# Patient Record
Sex: Male | Born: 1965 | Race: Black or African American | Hispanic: No | Marital: Married | Smoking: Former smoker
Health system: Southern US, Community
[De-identification: ages and names within clinical notes are randomized; demographics above are authoritative.]

## PROBLEM LIST (undated history)

## (undated) DIAGNOSIS — E119 Type 2 diabetes mellitus without complications: Secondary | ICD-10-CM

## (undated) DIAGNOSIS — M51369 Other intervertebral disc degeneration, lumbar region without mention of lumbar back pain or lower extremity pain: Secondary | ICD-10-CM

## (undated) DIAGNOSIS — M5136 Other intervertebral disc degeneration, lumbar region: Secondary | ICD-10-CM

## (undated) DIAGNOSIS — G5600 Carpal tunnel syndrome, unspecified upper limb: Secondary | ICD-10-CM

## (undated) DIAGNOSIS — F4310 Post-traumatic stress disorder, unspecified: Secondary | ICD-10-CM

## (undated) DIAGNOSIS — M5126 Other intervertebral disc displacement, lumbar region: Secondary | ICD-10-CM

## (undated) HISTORY — DX: Post-traumatic stress disorder, unspecified: F43.10

---

## 1999-09-19 ENCOUNTER — Ambulatory Visit (HOSPITAL_COMMUNITY): Admission: RE | Admit: 1999-09-19 | Discharge: 1999-09-19 | Payer: Self-pay | Admitting: *Deleted

## 1999-09-19 ENCOUNTER — Encounter: Payer: Self-pay | Admitting: *Deleted

## 2000-03-30 ENCOUNTER — Encounter: Payer: Self-pay | Admitting: *Deleted

## 2000-03-30 ENCOUNTER — Inpatient Hospital Stay (HOSPITAL_COMMUNITY): Admission: EM | Admit: 2000-03-30 | Discharge: 2000-04-04 | Payer: Self-pay | Admitting: Emergency Medicine

## 2000-04-08 ENCOUNTER — Encounter: Admission: RE | Admit: 2000-04-08 | Discharge: 2000-07-07 | Payer: Self-pay | Admitting: *Deleted

## 2000-05-14 ENCOUNTER — Encounter: Admission: RE | Admit: 2000-05-14 | Discharge: 2000-06-05 | Payer: Self-pay | Admitting: Neurology

## 2001-04-25 ENCOUNTER — Emergency Department (HOSPITAL_COMMUNITY): Admission: EM | Admit: 2001-04-25 | Discharge: 2001-04-25 | Payer: Self-pay | Admitting: Emergency Medicine

## 2004-04-08 ENCOUNTER — Emergency Department (HOSPITAL_COMMUNITY): Admission: EM | Admit: 2004-04-08 | Discharge: 2004-04-08 | Payer: Self-pay | Admitting: Family Medicine

## 2004-07-01 ENCOUNTER — Emergency Department (HOSPITAL_COMMUNITY): Admission: EM | Admit: 2004-07-01 | Discharge: 2004-07-01 | Payer: Self-pay | Admitting: Family Medicine

## 2007-09-28 ENCOUNTER — Ambulatory Visit: Payer: Self-pay | Admitting: Nurse Practitioner

## 2007-09-28 DIAGNOSIS — E119 Type 2 diabetes mellitus without complications: Secondary | ICD-10-CM | POA: Insufficient documentation

## 2007-09-28 LAB — CONVERTED CEMR LAB
ALT: 31 units/L (ref 0–53)
AST: 16 units/L (ref 0–37)
Albumin: 4.5 g/dL (ref 3.5–5.2)
Alkaline Phosphatase: 61 units/L (ref 39–117)
BUN: 11 mg/dL (ref 6–23)
Basophils Absolute: 0 10*3/uL (ref 0.0–0.1)
Basophils Relative: 0 % (ref 0–1)
Bilirubin Urine: NEGATIVE
Blood Glucose, Fingerstick: 257
Blood in Urine, dipstick: NEGATIVE
CO2: 29 meq/L (ref 19–32)
Calcium: 9.8 mg/dL (ref 8.4–10.5)
Chloride: 102 meq/L (ref 96–112)
Cholesterol: 169 mg/dL (ref 0–200)
Creatinine, Ser: 1.06 mg/dL (ref 0.40–1.50)
Eosinophils Absolute: 0.1 10*3/uL (ref 0.0–0.7)
Eosinophils Relative: 1 % (ref 0–5)
Glucose, Bld: 195 mg/dL — ABNORMAL HIGH (ref 70–99)
Glucose, Urine, Semiquant: NEGATIVE
HCT: 50.4 % (ref 39.0–52.0)
HDL: 53 mg/dL (ref >39)
Hemoglobin: 15.9 g/dL (ref 13.0–17.0)
Hgb A1c MFr Bld: 7.2 %
Ketones, urine, test strip: NEGATIVE
LDL Cholesterol: 95 mg/dL (ref 0–99)
Lymphocytes Relative: 35 % (ref 12–46)
Lymphs Abs: 1.7 10*3/uL (ref 0.7–3.3)
MCHC: 31.5 g/dL (ref 30.0–36.0)
MCV: 100.8 fL — ABNORMAL HIGH (ref 78.0–100.0)
Microalb, Ur: 0.25 mg/dL (ref 0.00–1.89)
Monocytes Absolute: 0.3 10*3/uL (ref 0.2–0.7)
Monocytes Relative: 7 % (ref 3–11)
Neutro Abs: 2.7 10*3/uL (ref 1.7–7.7)
Neutrophils Relative %: 56 % (ref 43–77)
Nitrite: NEGATIVE
PSA: 0.99 ng/mL (ref 0.10–4.00)
Platelets: 330 10*3/uL (ref 150–400)
Potassium: 5 meq/L (ref 3.5–5.3)
Protein, U semiquant: NEGATIVE
RBC: 5 M/uL (ref 4.22–5.81)
RDW: 12.6 % (ref 11.5–14.0)
Sodium: 142 meq/L (ref 135–145)
Specific Gravity, Urine: 1.01
Total Bilirubin: 0.8 mg/dL (ref 0.3–1.2)
Total CHOL/HDL Ratio: 3.2
Total Protein: 7.6 g/dL (ref 6.0–8.3)
Triglycerides: 106 mg/dL (ref <150)
Urobilinogen, UA: 0.2
VLDL: 21 mg/dL (ref 0–40)
WBC Urine, dipstick: NEGATIVE
WBC: 4.9 10*3/uL (ref 4.0–10.5)
pH: 5.5

## 2007-11-02 ENCOUNTER — Ambulatory Visit: Payer: Self-pay | Admitting: Nurse Practitioner

## 2007-11-02 DIAGNOSIS — F528 Other sexual dysfunction not due to a substance or known physiological condition: Secondary | ICD-10-CM | POA: Insufficient documentation

## 2007-11-08 ENCOUNTER — Encounter (INDEPENDENT_AMBULATORY_CARE_PROVIDER_SITE_OTHER): Payer: Self-pay | Admitting: Nurse Practitioner

## 2007-12-22 ENCOUNTER — Ambulatory Visit: Payer: Self-pay | Admitting: Nurse Practitioner

## 2007-12-22 LAB — CONVERTED CEMR LAB
Blood Glucose, Fingerstick: 159
Hgb A1c MFr Bld: 8 %

## 2007-12-28 ENCOUNTER — Ambulatory Visit: Payer: Self-pay | Admitting: Nurse Practitioner

## 2008-02-02 ENCOUNTER — Ambulatory Visit: Payer: Self-pay | Admitting: Nurse Practitioner

## 2008-02-02 LAB — CONVERTED CEMR LAB: Blood Glucose, Fingerstick: 128

## 2008-02-08 ENCOUNTER — Encounter (INDEPENDENT_AMBULATORY_CARE_PROVIDER_SITE_OTHER): Payer: Self-pay | Admitting: Nurse Practitioner

## 2008-03-21 ENCOUNTER — Telehealth (INDEPENDENT_AMBULATORY_CARE_PROVIDER_SITE_OTHER): Payer: Self-pay | Admitting: Nurse Practitioner

## 2008-03-22 ENCOUNTER — Ambulatory Visit: Payer: Self-pay | Admitting: *Deleted

## 2008-04-07 ENCOUNTER — Ambulatory Visit: Payer: Self-pay | Admitting: Nurse Practitioner

## 2008-04-07 DIAGNOSIS — L0292 Furuncle, unspecified: Secondary | ICD-10-CM | POA: Insufficient documentation

## 2008-04-07 DIAGNOSIS — L0293 Carbuncle, unspecified: Secondary | ICD-10-CM

## 2008-05-10 ENCOUNTER — Ambulatory Visit: Payer: Self-pay | Admitting: Nurse Practitioner

## 2008-05-10 DIAGNOSIS — B353 Tinea pedis: Secondary | ICD-10-CM

## 2008-05-10 LAB — CONVERTED CEMR LAB
Blood Glucose, Fingerstick: 178
Hgb A1c MFr Bld: 7.9 %

## 2008-06-14 ENCOUNTER — Telehealth (INDEPENDENT_AMBULATORY_CARE_PROVIDER_SITE_OTHER): Payer: Self-pay | Admitting: *Deleted

## 2008-06-28 ENCOUNTER — Ambulatory Visit: Payer: Self-pay | Admitting: Nurse Practitioner

## 2008-06-28 LAB — CONVERTED CEMR LAB: Blood Glucose, Fingerstick: 68

## 2008-07-10 ENCOUNTER — Telehealth (INDEPENDENT_AMBULATORY_CARE_PROVIDER_SITE_OTHER): Payer: Self-pay | Admitting: Nurse Practitioner

## 2008-09-04 ENCOUNTER — Telehealth (INDEPENDENT_AMBULATORY_CARE_PROVIDER_SITE_OTHER): Payer: Self-pay | Admitting: *Deleted

## 2008-09-12 ENCOUNTER — Telehealth (INDEPENDENT_AMBULATORY_CARE_PROVIDER_SITE_OTHER): Payer: Self-pay | Admitting: Nurse Practitioner

## 2008-10-20 ENCOUNTER — Telehealth (INDEPENDENT_AMBULATORY_CARE_PROVIDER_SITE_OTHER): Payer: Self-pay | Admitting: Nurse Practitioner

## 2008-11-28 ENCOUNTER — Telehealth (INDEPENDENT_AMBULATORY_CARE_PROVIDER_SITE_OTHER): Payer: Self-pay | Admitting: Nurse Practitioner

## 2008-12-06 ENCOUNTER — Telehealth (INDEPENDENT_AMBULATORY_CARE_PROVIDER_SITE_OTHER): Payer: Self-pay | Admitting: Nurse Practitioner

## 2009-01-03 ENCOUNTER — Ambulatory Visit: Payer: Self-pay | Admitting: Nurse Practitioner

## 2009-01-03 DIAGNOSIS — E7800 Pure hypercholesterolemia, unspecified: Secondary | ICD-10-CM | POA: Insufficient documentation

## 2009-01-03 DIAGNOSIS — M79609 Pain in unspecified limb: Secondary | ICD-10-CM | POA: Insufficient documentation

## 2009-01-03 LAB — CONVERTED CEMR LAB
Basophils Absolute: 0 10*3/uL (ref 0.0–0.1)
Blood in Urine, dipstick: NEGATIVE
CO2: 27 meq/L (ref 19–32)
Cholesterol: 178 mg/dL (ref 0–200)
Creatinine, Ser: 1.05 mg/dL (ref 0.40–1.50)
Eosinophils Absolute: 0 10*3/uL (ref 0.0–0.7)
Eosinophils Relative: 1 % (ref 0–5)
Glucose, Bld: 105 mg/dL — ABNORMAL HIGH (ref 70–99)
Glucose, Urine, Semiquant: NEGATIVE
HCT: 47.3 % (ref 39.0–52.0)
Hemoglobin: 15.2 g/dL (ref 13.0–17.0)
Lymphocytes Relative: 34 % (ref 12–46)
Lymphs Abs: 1.5 10*3/uL (ref 0.7–4.0)
MCV: 99.2 fL (ref 78.0–100.0)
Monocytes Absolute: 0.5 10*3/uL (ref 0.1–1.0)
Protein, U semiquant: NEGATIVE
RDW: 12.6 % (ref 11.5–15.5)
Total Bilirubin: 0.8 mg/dL (ref 0.3–1.2)
Total CHOL/HDL Ratio: 3.1
Triglycerides: 76 mg/dL (ref <150)
Urobilinogen, UA: 1
VLDL: 15 mg/dL (ref 0–40)
WBC Urine, dipstick: NEGATIVE
pH: 6

## 2009-01-04 ENCOUNTER — Encounter (INDEPENDENT_AMBULATORY_CARE_PROVIDER_SITE_OTHER): Payer: Self-pay | Admitting: Nurse Practitioner

## 2009-01-05 ENCOUNTER — Ambulatory Visit: Payer: Self-pay | Admitting: Nurse Practitioner

## 2009-01-09 ENCOUNTER — Telehealth (INDEPENDENT_AMBULATORY_CARE_PROVIDER_SITE_OTHER): Payer: Self-pay | Admitting: *Deleted

## 2009-01-10 ENCOUNTER — Ambulatory Visit: Payer: Self-pay | Admitting: Family Medicine

## 2009-01-10 ENCOUNTER — Encounter (INDEPENDENT_AMBULATORY_CARE_PROVIDER_SITE_OTHER): Payer: Self-pay | Admitting: Nurse Practitioner

## 2009-01-15 ENCOUNTER — Encounter (INDEPENDENT_AMBULATORY_CARE_PROVIDER_SITE_OTHER): Payer: Self-pay | Admitting: Nurse Practitioner

## 2009-01-17 ENCOUNTER — Encounter (INDEPENDENT_AMBULATORY_CARE_PROVIDER_SITE_OTHER): Payer: Self-pay | Admitting: Nurse Practitioner

## 2009-01-26 ENCOUNTER — Telehealth (INDEPENDENT_AMBULATORY_CARE_PROVIDER_SITE_OTHER): Payer: Self-pay | Admitting: Nurse Practitioner

## 2009-01-26 DIAGNOSIS — S62113A Displaced fracture of triquetrum [cuneiform] bone, unspecified wrist, initial encounter for closed fracture: Secondary | ICD-10-CM

## 2009-01-29 ENCOUNTER — Ambulatory Visit (HOSPITAL_COMMUNITY): Admission: RE | Admit: 2009-01-29 | Discharge: 2009-01-29 | Payer: Self-pay | Admitting: Family Medicine

## 2009-02-20 ENCOUNTER — Telehealth (INDEPENDENT_AMBULATORY_CARE_PROVIDER_SITE_OTHER): Payer: Self-pay | Admitting: Nurse Practitioner

## 2011-01-12 LAB — CONVERTED CEMR LAB
Blood Glucose, Fingerstick: 186
Hgb A1c MFr Bld: 8 %

## 2011-01-27 ENCOUNTER — Inpatient Hospital Stay (INDEPENDENT_AMBULATORY_CARE_PROVIDER_SITE_OTHER)
Admission: RE | Admit: 2011-01-27 | Discharge: 2011-01-27 | Disposition: A | Discharge status: Home / Self Care | Payer: BC Managed Care – PPO | Source: Ambulatory Visit | Attending: Emergency Medicine | Admitting: Emergency Medicine

## 2011-01-27 DIAGNOSIS — L738 Other specified follicular disorders: Secondary | ICD-10-CM

## 2011-05-02 NOTE — Discharge Summary (Signed)
[place]. [facility]  Patient:    Chris [name], Chris [name]                       MRN: [id number] Adm. Date:  [id number] Disc. Date: [id number] Attending:  Sharyn Dross                           Discharge Summary  ADMITTING DIAGNOSIS:  Uncontrolled diabetes mellitus, new onset.  DISCHARGE DIAGNOSES:  1. Diabetes mellitus, still uncontrolled (new onset, possible ___________     syndrome).  2. Lower back pain, stable.  CONDITION ON DISCHARGE:  Stable.  DISCHARGE MEDICATIONS:  1. Actos 15 mg p.o. in a.m.  2. Glucotrol XL 10 mg b.i.d.  3. Glucovance 5/500 two tablets b.i.d.  COMPLICATIONS:  None.  CONSULTATIONS:  Dr. Ardyth Harps, endocrinologist.  HOSPITAL COURSE:  The patient was admitted into the hospital with marked evidence of uncontrolled diabetes.  He was primarily admitted because of increasing discomforts with fever, weakness, and dizziness.  It was felt this possibly could be related to Neurontin problems at this time.  Followup, when the patient was found to be hyperglycemia, the treatment was initially and aggressively geared towards getting him under control.  The patient was started with saline fluid for hydration, as well as insulin to help to bring him down.  It was noted during this process that while attempting to improve his insulin levels, the glucose levels  continually rebounded to the 300 plus range.  The patients insulin coverage had to be adjusted to higher numbers with still elevations of this process noted.  An endocrine consultation was obtained to help with the management of the patient and his overall condition.  It was interesting, but initially noted that the patient could be a candidate for having what has been diagnosed as the ___________ syndrome.  In discussing with the endocrinologist, it was noted that the patient fit the category very well (age, symptoms, and response to therapy).  The recommendation was to continue  with the oral hypoglycemic agents and to hold with the insulin dosages at this time.  This has been done, but the process of response appears to be slow.  In discussing with the endocrinologist, it was felt that this may take time to be ongoing.  The patients condition was stable to a point that it was felt (both by the endocrinologist and myself), that the patient could be discharged to home taking the oral hypoglycemic agents.  He should be followed for the management and adjustments of his medications to get this under control.  The family states that the insurance company has their own endocrinologist that  they want the patient to be seen by.  Because of the economic constraints that re noted, they wish that this could be complied with.  Thus, the patient was referred to another endocrinologist for the management of this process at this time.  FOLLOWUP:  The patient is to follow up with me in approximately two weeks to monitor his sugars on an hour before meals and bedtime basis and to notify me if there is are any problems or complications, until such time as the consultation  could be obtained.  The patient and the family was also notified that if the new endocrine consultation evaluation visit is too far off, then he should continue to see the present endocrinologist during the interim until this process could be better controlled, unless  he starts to respond to therapy.  FINAL DIAGNOSES:  As above.  CLINICAL CONDITION:  Stable. DD:  04/04/00 TD:  04/06/00 Job: [id number]

## 2011-05-02 NOTE — Consult Note (Signed)
[place]. [facility]  Patient:    Chris [name], Chris [name]                       MRN: [id number] Proc. Date: 04/02/00 Adm. Date:  [id number] Attending:  Sharyn Dross CC:         Sharyn Dross., M.D.                          Consultation Report  Chris [name] is a 45 year old black male who presented on April 16 with new onset diabetes.  He had symptoms present for 3-4 days with a 15-20 pound weight loss ver an unspecified amount of time, nocturia, blurred vision, polydipsia, polyuria, nd some neuropathic complaints.  He had had prior neuropathic complaints from a herniated disk and attributed  some of the symptoms to that.  His energy was down and he had felt less well.  He had had no fevers, chills, or sweats.  He had had no other neurologic complaints.  No breathing trouble or chest pain.  He had no prior history of any abnormal blood sugar, blood pressure, or dyslipidemia.  He presented with significant acidosis with the bicarb down to 13 and positive urine ketones at greater than 80 mg/dL, with initial blood sugar of 434.  He did not have a measurement of serum ketones at that time.  A CT scan of the head was negative nd cardiogram was also normal at presentation.  He has been given vigorous hydration and IV insulin drip, and blood sugars have markedly improved.  Most recently today, his blood sugars are down, 247 at midnight, 199 at 1 a.m., and 146 at 6 a.m. His symptoms have markedly improved.  PAST MEDICAL HISTORY:  Positive for the herniated disk and an elbow injury.  FAMILY HISTORY:  Type 2 diabetes in the mother and some cousins.  SOCIAL HISTORY:  He is married.  He has two of his own children, three stepchildren ranging from ages 73-14.  He is employed with [employer].  He is a nonsmoker, nondrinker.  PHYSICAL EXAMINATION:  VITAL SIGNS:  Currently, blood pressure is 120/63, pulse 84 and regular, respirations 20, unlabored.  Temperature  98.4.  GENERAL:  We have a healthy-appearing black male sitting on the edge of his bed in no distress.  HEENT:  Sclerae anicteric.  There is no lid lag or exophthalmus.  Mucous membranes are moist.  There is no evidence of oral thrush.  Oral mucous membranes are well hydrated.  NECK:  Supple without thyromegaly, bruits, or JVD.  LUNGS:  Clear without wheezes, rales, or rhonchi.  HEART:  Regular without murmurs, rubs, gallops.  ABDOMEN:  Soft and nontender with no masses or hepatosplenomegaly.  EXTREMITIES:  Strong distal pulses with no peripheral edema.  SKIN:  Positive for acanthosis nigricans prominently around the neck.  NEUROLOGIC:  He is alert and oriented.  Mentation is good.  No tremors present.  LABORATORY DATA:  Most recent sodium 134, potassium 3.6, chloride 101, CO2 23, UN 14, creatinine 1.0, glucose 443, calcium 9.1.  At presentation, white count was  9500, hemoglobin 18.1, platelets 200,000.  Initial chemistry:  Sodium 135, potassium 5.5, chloride 100, CO2 13, BUN 18, creatinine 1.7, glucose 434, total  protein 8.8, albumin 4.4, AST 20.  Initial lipids: Total cholesterol 338, triglycerides 619, HDL 67.  Urinalysis showed greater than 100 mg glucose, greater than 80 mg ketones, and 100 mg%  protein.  IMPRESSION:  In summary, we have a 45 year old black male presenting with new onset diabetes.  At presentation he was at least relatively insulin deficient with modest acidosis and hyperglycemia.  His acidosis has promptly resolved and his renal function has normalized.  His dehydration seems to be resolved.  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  Evidence that supports this includes the finding of  acanthosis nigricans on his physical exam, his underlying obesity, his dyslipidemia, and diabetes type 2 in a first-degree relative.  This would predict he would respond well  to oral agents.  Other evidence that would support this, f needed, would be a C-peptide level, although I would not check it at this point, or more importantly, a stimulated C-peptide level.  The usefulness of antibodies such as islet antibodies and anti-GAD antibodies are limited in non-Caucasian. Given that his creatinine is now down to 1.0, I would restart the Glucophage.  I agree with this, and I would continue the Glucotrol and watch carefully for 24-48 hours further.  Encourage oral hydration and watch him carefully.  We will have to monitor him for any signs of extreme hyperglycemia.  The possibility does still  exist that he is a more typical type 1 diabetes and will require insulin therapy. DD:  04/02/00 TD:  04/02/00 Job: [id number]

## 2011-05-02 NOTE — H&P (Signed)
[place]. [facility]  Patient:    Chris [name], Chris [name].                      MRN: [id number] Adm. Date:  03/30/00 Attending:  Sharyn Dross., M.D.                         History and Physical  BRIEF HISTORY:  This pleasant 45 year old gentleman presented and was admitted into the hospital because of new onset of uncontrolled diabetes mellitus.  The patient has been seen by me over the last few weeks because of severe back pain and some discomforts that was ongoing.  The patient had difficulty with lifting and bending that was present at that time.  The patient had evidence of a CT Scan of the lumbosacral region which showed evidence of possible stenosis that may be involved with the risk region at that time.  He was started with neurontin medication to take t.i.d. approximately 600 mg three time a day at that time.  The patient states that while on this medication the patient started developing increasing blurred vision and discomfort that was noted.  He presented back to the office today with associated nauseousness and vomiting that had started early in the morning with increased dizziness and weakness ongoing.  Upon evaluation in the office it was felt that the patient needed to be seen in the emergency room for laboratory data as well a repeat scan.  Associated with this problem is headaches that was noted.  He denied any history of any polyuria or polydipsia initially because it was not entertained and considering this to be in relationship with diabetes it felt it was not related to what his symptoms were.  In the emergency room the patient had lab work done and I was notified of a glucose level of 434.  In discussing with the patient the patient does admit to having increased polyuria and polydipsia as well as nocturia that has been ongoing now for the past few weeks that was present.  The patient denies any family history of diabetes mellitus that was  present at this time.  PHYSICAL EXAMINATION:  This pleasant gentleman appears to be in mild to moderate distress at this time.  VITAL SIGNS:  Appear to be stable with a blood pressure of 136/74, repeated it was 148/98.  Pulse rate 106, and on repeat it was a 103.  His temperature was 99.0, repeated it was 98.2.  Respiratory rate was approximately 20 that was noted.  HEENT:  Appears to be anicteric.  NECK:  Appears to be supple.  LUNGS:  Clear to auscultation and percussion.  HEART:  Regular rate and rhythm without heaves, thrills, murmurs or gallops that was noted.  ABDOMEN:  Was soft.  There was no major tenderness to palpation that was appreciated.  Bowel sounds were decreased that was noted.  EXTREMITIES:  Showed no clubbing, cyanosis, or edema.  GROSS IMPRESSION: 1. Uncontrolled diabetes mellitus, new onset. 2. Lower back pains, possible herniated nucleus pulposus.  PRESENT RECOMMENDATIONS: 1. Will admit the patient into the hospital for hydration and stabilization. 2. We started the patient with a glucometer to monitor his sugar levels. 3. Will have dietician consultation for education of the patient with    diabetes as well as his family during this process. 4. Will also obtain other tests that may be beneficial for the patient.    Depending upon these  results will determine the course of therapy. DD:  03/30/00 TD:  03/30/00 Job: [id number]

## 2013-10-26 ENCOUNTER — Emergency Department (HOSPITAL_COMMUNITY)
Admission: EM | Admit: 2013-10-26 | Discharge: 2013-10-26 | Disposition: A | Discharge status: Home / Self Care | Payer: BC Managed Care – PPO | Attending: Emergency Medicine | Admitting: Emergency Medicine

## 2013-10-26 ENCOUNTER — Encounter (HOSPITAL_COMMUNITY): Payer: Self-pay | Admitting: Emergency Medicine

## 2013-10-26 ENCOUNTER — Emergency Department (HOSPITAL_COMMUNITY): Payer: BC Managed Care – PPO

## 2013-10-26 DIAGNOSIS — Z8739 Personal history of other diseases of the musculoskeletal system and connective tissue: Secondary | ICD-10-CM | POA: Insufficient documentation

## 2013-10-26 DIAGNOSIS — Z79899 Other long term (current) drug therapy: Secondary | ICD-10-CM | POA: Insufficient documentation

## 2013-10-26 DIAGNOSIS — Z87891 Personal history of nicotine dependence: Secondary | ICD-10-CM | POA: Insufficient documentation

## 2013-10-26 DIAGNOSIS — E119 Type 2 diabetes mellitus without complications: Secondary | ICD-10-CM | POA: Insufficient documentation

## 2013-10-26 DIAGNOSIS — Z8669 Personal history of other diseases of the nervous system and sense organs: Secondary | ICD-10-CM | POA: Insufficient documentation

## 2013-10-26 DIAGNOSIS — R1011 Right upper quadrant pain: Secondary | ICD-10-CM | POA: Insufficient documentation

## 2013-10-26 DIAGNOSIS — Z794 Long term (current) use of insulin: Secondary | ICD-10-CM | POA: Insufficient documentation

## 2013-10-26 HISTORY — DX: Other intervertebral disc degeneration, lumbar region: M51.36

## 2013-10-26 HISTORY — DX: Other intervertebral disc displacement, lumbar region: M51.26

## 2013-10-26 HISTORY — DX: Type 2 diabetes mellitus without complications: E11.9

## 2013-10-26 HISTORY — DX: Other intervertebral disc degeneration, lumbar region without mention of lumbar back pain or lower extremity pain: M51.369

## 2013-10-26 HISTORY — DX: Carpal tunnel syndrome, unspecified upper limb: G56.00

## 2013-10-26 LAB — URINALYSIS, ROUTINE W REFLEX MICROSCOPIC
Ketones, ur: NEGATIVE mg/dL
Leukocytes, UA: NEGATIVE
Nitrite: NEGATIVE
Protein, ur: NEGATIVE mg/dL
Urobilinogen, UA: 2 mg/dL — ABNORMAL HIGH (ref 0.0–1.0)
pH: 6.5 (ref 5.0–8.0)

## 2013-10-26 LAB — CBC WITH DIFFERENTIAL/PLATELET
Eosinophils Absolute: 0.1 10*3/uL (ref 0.0–0.7)
Eosinophils Relative: 2 % (ref 0–5)
HCT: 39.7 % (ref 39.0–52.0)
Hemoglobin: 13.3 g/dL (ref 13.0–17.0)
Lymphs Abs: 2.3 10*3/uL (ref 0.7–4.0)
MCH: 31.4 pg (ref 26.0–34.0)
MCV: 93.6 fL (ref 78.0–100.0)
Monocytes Relative: 8 % (ref 3–12)
Neutrophils Relative %: 51 % (ref 43–77)
RBC: 4.24 MIL/uL (ref 4.22–5.81)

## 2013-10-26 LAB — COMPREHENSIVE METABOLIC PANEL
Alkaline Phosphatase: 76 U/L (ref 39–117)
BUN: 12 mg/dL (ref 6–23)
GFR calc Af Amer: >90 mL/min (ref >90)
Glucose, Bld: 166 mg/dL — ABNORMAL HIGH (ref 70–99)
Potassium: 3.9 mEq/L (ref 3.5–5.1)
Total Protein: 7.1 g/dL (ref 6.0–8.3)

## 2013-10-26 LAB — LIPASE, BLOOD: Lipase: 96 U/L — ABNORMAL HIGH (ref 11–59)

## 2013-10-26 MED ORDER — OXYCODONE-ACETAMINOPHEN 5-325 MG PO TABS: 2.0000 | ORAL_TABLET | ORAL | Status: DC | PRN

## 2013-10-26 NOTE — ED Provider Notes (Signed)
CSN: [id number]     Arrival date & time 10/26/13  1809 History   First MD Initiated Contact with Patient 10/26/13 1909     Chief Complaint  Patient presents with  . Abdominal Pain   (Consider location/radiation/quality/duration/timing/severity/associated sxs/prior Treatment) HPI This 47 year old diabetic states his blood sugars usually run less than 200, he is here with intermittent right upper quadrant abdominal pain for the last several days, he has had no nausea vomiting diarrhea or dysuria or hematuria, he is no chest pain cough shortness of breath or back pain, he has nonexertional nonpleuritic non-positional sudden sharp colicky intermittent and last less than several minutes at a time, he has several episodes per day over the last several days, he is no testicular pain, he is no rash, there is no treatment prior to arrival. Past Medical History  Diagnosis Date  . Diabetes mellitus without complication   . Bulging lumbar disc   . Carpal tunnel syndrome    History reviewed. No pertinent past surgical history. Family History  Problem Relation Age of Onset  . Diabetes Mother    History  Substance Use Topics  . Smoking status: Former Games developer  . Smokeless tobacco: Not on file  . Alcohol Use: No    Review of Systems 10 Systems reviewed and are negative for acute change except as noted in the HPI. Allergies  Review of patient's allergies indicates no known allergies.  Home Medications   Current Outpatient Rx  Name  Route  Sig  Dispense  Refill  . atorvastatin (LIPITOR) 40 MG tablet   Oral   Take 20 mg by mouth daily.         Marland Kitchen HYDROcodone-acetaminophen (NORCO) 7.5-325 MG per tablet   Oral   Take 1 tablet by mouth every 6 (six) hours as needed for moderate pain.         Marland Kitchen LANTUS 100 UNIT/ML injection   Subcutaneous   Inject 50 Units into the skin at bedtime.          Marland Kitchen NOVOLOG FLEXPEN 100 UNIT/ML SOPN FlexPen   Subcutaneous   Inject 5-7 Units into the skin 3  (three) times daily with meals. Per sliding scale         . omeprazole (PRILOSEC) 40 MG capsule   Oral   Take 40 mg by mouth daily.         Marland Kitchen oxyCODONE-acetaminophen (PERCOCET) 5-325 MG per tablet   Oral   Take 2 tablets by mouth every 4 (four) hours as needed.   6 tablet   0    BP 135/91  Pulse 79  Temp(Src) 98.1 F (36.7 C) (Oral)  Resp 20  SpO2 97% Physical Exam  Nursing note and vitals reviewed. Constitutional:  Awake, alert, nontoxic appearance.  HENT:  Head: Atraumatic.  Eyes: Right eye exhibits no discharge. Left eye exhibits no discharge.  Neck: Neck supple.  Cardiovascular: Normal rate and regular rhythm.   No murmur heard. Pulmonary/Chest: Effort normal and breath sounds normal. No respiratory distress. He has no wheezes. He has no rales. He exhibits no tenderness.  Abdominal: Soft. Bowel sounds are normal. He exhibits no distension and no mass. There is tenderness. There is no rebound and no guarding.  Mild right upper quadrant tenderness with the rest of the abdomen nontender  Genitourinary:  No CVA tenderness  Musculoskeletal: He exhibits no tenderness.  Baseline ROM, no obvious new focal weakness. No back tenderness and no rash noted on his back flank or abdomen.  Neurological: He is alert.  Mental status and motor strength appears baseline for patient and situation.  Skin: No rash noted.  Psychiatric: He has a normal mood and affect.    ED Course  Procedures (including critical care time) Repeat examination still mild right upper quadrant tenderness only with the rest of abdomen nontender, he'll recheck tomorrow or reasonable and the patient agrees, lipase mildly elevated but not diagnostic for pancreatitis. 2145  Labs Review Labs Reviewed  COMPREHENSIVE METABOLIC PANEL - Abnormal; Notable for the following:    Glucose, Bld 166 (*)    All other components within normal limits  URINALYSIS, ROUTINE W REFLEX MICROSCOPIC - Abnormal; Notable for the  following:    Urobilinogen, UA 2.0 (*)    All other components within normal limits  GLUCOSE, CAPILLARY - Abnormal; Notable for the following:    Glucose-Capillary 169 (*)    All other components within normal limits  LIPASE, BLOOD - Abnormal; Notable for the following:    Lipase 96 (*)    All other components within normal limits  CBC WITH DIFFERENTIAL   Imaging Review No results found.  EKG Interpretation   None       MDM   1. Right upper quadrant abdominal pain    I doubt any other EMC precluding discharge at this time including, but not necessarily limited to the following:SBI.    Hurman Horn, MD 10/31/13 [phone]

## 2013-10-26 NOTE — ED Notes (Signed)
Pt went to urgent care and was started on a medication to decrease his stomach acid. States that he has taken that for 2 days with no change. Pt also states that he has brief period of relief with flatus and when having a BM.

## 2013-10-26 NOTE — ED Notes (Signed)
Pt c/o RLQ pain x1wk, pain radiates upper and to the side., denies n/v/d

## 2013-10-27 ENCOUNTER — Encounter (HOSPITAL_COMMUNITY): Payer: Self-pay | Admitting: Emergency Medicine

## 2013-10-27 ENCOUNTER — Emergency Department (HOSPITAL_COMMUNITY)
Admission: EM | Admit: 2013-10-27 | Discharge: 2013-10-27 | Disposition: A | Discharge status: Home / Self Care | Payer: BC Managed Care – PPO | Attending: Emergency Medicine | Admitting: Emergency Medicine

## 2013-10-27 DIAGNOSIS — R109 Unspecified abdominal pain: Secondary | ICD-10-CM | POA: Insufficient documentation

## 2013-10-27 DIAGNOSIS — Z794 Long term (current) use of insulin: Secondary | ICD-10-CM | POA: Insufficient documentation

## 2013-10-27 DIAGNOSIS — Z8669 Personal history of other diseases of the nervous system and sense organs: Secondary | ICD-10-CM | POA: Insufficient documentation

## 2013-10-27 DIAGNOSIS — Z8739 Personal history of other diseases of the musculoskeletal system and connective tissue: Secondary | ICD-10-CM | POA: Insufficient documentation

## 2013-10-27 DIAGNOSIS — E119 Type 2 diabetes mellitus without complications: Secondary | ICD-10-CM | POA: Insufficient documentation

## 2013-10-27 DIAGNOSIS — Z79899 Other long term (current) drug therapy: Secondary | ICD-10-CM | POA: Insufficient documentation

## 2013-10-27 DIAGNOSIS — Z87891 Personal history of nicotine dependence: Secondary | ICD-10-CM | POA: Insufficient documentation

## 2013-10-27 LAB — GLUCOSE, CAPILLARY: Glucose-Capillary: 80 mg/dL (ref 70–99)

## 2013-10-27 NOTE — ED Notes (Signed)
Patient was here last night. Patient states need for CT scan. PCP was not able to schedule appointment for today. Appt scheduled for 11/04/13. Returned to ED for CT scan.

## 2013-10-27 NOTE — ED Provider Notes (Signed)
CSN: [id number]     Arrival date & time 10/27/13  1103 History   First MD Initiated Contact with Patient 10/27/13 1132     Chief Complaint  Patient presents with  . Abdominal Pain   (Consider location/radiation/quality/duration/timing/severity/associated sxs/prior Treatment) Patient is a 47 y.o. male presenting with abdominal pain.  Abdominal Pain Associated symptoms: no chills, no constipation, no diarrhea, no dysuria, no fatigue, no fever, no hematuria, no nausea and no vomiting     Patient was instructed to have a a CT exam today by ED physician last night. He planned to complete this at [place], but was unable to get this done. Of note, the patients abdominal pain is resolving. The patient has not required any pain medication today. He is tolerating food and water without problems.   Past Medical History  Diagnosis Date  . Diabetes mellitus without complication   . Bulging lumbar disc   . Carpal tunnel syndrome    History reviewed. No pertinent past surgical history. Family History  Problem Relation Age of Onset  . Diabetes Mother    History  Substance Use Topics  . Smoking status: Former Games developer  . Smokeless tobacco: Not on file  . Alcohol Use: No    Review of Systems  Constitutional: Negative for fever, chills, activity change, appetite change and fatigue.  Gastrointestinal: Positive for abdominal pain. Negative for nausea, vomiting, diarrhea, constipation, blood in stool, abdominal distention and rectal pain.  Genitourinary: Negative for dysuria, frequency, hematuria and flank pain.  Neurological: Negative for dizziness, light-headedness and headaches.    Allergies  Review of patient's allergies indicates no known allergies.  Home Medications   Current Outpatient Rx  Name  Route  Sig  Dispense  Refill  . atorvastatin (LIPITOR) 40 MG tablet   Oral   Take 20 mg by mouth daily.         Marland Kitchen HYDROcodone-acetaminophen (NORCO) 7.5-325 MG per tablet   Oral   Take 1  tablet by mouth every 6 (six) hours as needed for moderate pain.         Marland Kitchen LANTUS 100 UNIT/ML injection   Subcutaneous   Inject 50 Units into the skin at bedtime.          Marland Kitchen NOVOLOG FLEXPEN 100 UNIT/ML SOPN FlexPen   Subcutaneous   Inject 5-7 Units into the skin 3 (three) times daily with meals. Per sliding scale         . omeprazole (PRILOSEC) 40 MG capsule   Oral   Take 40 mg by mouth daily.         Marland Kitchen oxyCODONE-acetaminophen (PERCOCET) 5-325 MG per tablet   Oral   Take 2 tablets by mouth every 4 (four) hours as needed.   6 tablet   0    BP 136/87  Pulse 78  Temp(Src) 98.1 F (36.7 C) (Oral)  Resp 12  SpO2 96% Physical Exam  Constitutional: He appears well-developed and well-nourished. He appears distressed.  HENT:  Head: Normocephalic.  Mouth/Throat: Oropharynx is clear and moist. No oropharyngeal exudate.  Cardiovascular: Normal rate, regular rhythm, normal heart sounds and intact distal pulses.   Abdominal: Soft. Bowel sounds are normal. He exhibits no distension and no mass. There is no tenderness. There is no rebound and no guarding.  Neurological: He is alert.  Skin: Skin is warm. He is not diaphoretic.  Psychiatric: He has a normal mood and affect. His behavior is normal.    ED Course  Procedures (including critical care time)  Labs Review Labs Reviewed  GLUCOSE, CAPILLARY   Imaging Review US Abdomen Complete  10/26/2013   CLINICAL DATA:  Right upper quadrant abdominal pain.  EXAM: ULTRASOUND ABDOMEN COMPLETE  COMPARISON:  None.  FINDINGS: Gallbladder  No gallstones or wall thickening visualized. No sonographic Murphy sign noted. Wall thickness is 1.8 mm, within normal limits.  Common bile duct  Diameter: 3.7 mm, within normal limits.  Liver  The liver is diffusely hyperechoic with loss of normal internal architecture, suggesting fatty infiltration. No discrete lesions are evident.  IVC  Limited visualization due to bowel gas.  Pancreas  Limited  visualization due to bowel gas. The visualized portions are within normal limits.  Spleen  Size and appearance within normal limits.  Right Kidney  Length: 10.0 cm, within normal limits. Echogenicity within normal limits. No mass or hydronephrosis visualized.  Left Kidney  Length: 10.3 cm, within normal limits. Echogenicity within normal limits. No mass or hydronephrosis visualized.  Abdominal aorta  The aorta is not visualized secondary to overlying bowel gas.  IMPRESSION: 1. Normal appearance of the gallbladder and kidneys. 2. Diffuse fatty infiltration of the liver. 3. The IVC, pancreas, and aorta are incompletely visualized due to overlying bowel gas.   Electronically Signed   By: Gennette Pac M.D.   On: 10/26/2013 20:23    EKG Interpretation   None       MDM  No diagnosis found.  1. Abdominal Pain The patients abdominal pain is resolving. Abdominal exam is benign. Lipase was mildly elevated yesterday at 96. US demonstrated no evidence of acute biliary pathology. I recommended that the patient not to have CT given improvement. Patient was instructed to return to ED for new or worsening symptoms. The patient agrees with this plan. [facility] already [facility].    Pleas Koch, MD 10/27/13 1216

## 2013-10-27 NOTE — ED Notes (Signed)
MD at bedside. 

## 2013-10-31 NOTE — ED Provider Notes (Signed)
I saw and evaluated the patient, reviewed the resident's note and I agree with the findings and plan.  EKG Interpretation   None      Patient with abdominal pain. It is improving. Seen yesterday with minimally elevated lipase but otherwise reassuring labs. Do not feel that he needs a CT at this time. Will discharge  Juliet Rude. Rubin Payor, MD 10/31/13 [phone]

## 2014-05-20 ENCOUNTER — Encounter (HOSPITAL_COMMUNITY): Payer: Self-pay | Admitting: Emergency Medicine

## 2014-05-20 ENCOUNTER — Emergency Department (HOSPITAL_COMMUNITY)
Admission: EM | Admit: 2014-05-20 | Discharge: 2014-05-20 | Disposition: A | Discharge status: Home / Self Care | Payer: BC Managed Care – PPO | Source: Home / Self Care

## 2014-05-20 DIAGNOSIS — L299 Pruritus, unspecified: Secondary | ICD-10-CM

## 2014-05-20 DIAGNOSIS — B356 Tinea cruris: Secondary | ICD-10-CM

## 2014-05-20 NOTE — ED Provider Notes (Signed)
CSN: [id number]     Arrival date & time 05/20/14  1609 History   First MD Initiated Contact with Patient 05/20/14 1634     Chief Complaint  Patient presents with  . Rash   (Consider location/radiation/quality/duration/timing/severity/associated sxs/prior Treatment) HPI Comments: 48 year old male describes itching it occurs intermittently over the upper arms and the axilla. There are no signs of a rash now and nothing to distinguish it from the normal scan. The second complaint is jock itch. He has been placing different types of creams and powders but none that contained antifungal treatments.  Patient is a 48 y.o. male presenting with rash.  Rash   Past Medical History  Diagnosis Date  . Diabetes mellitus without complication   . Bulging lumbar disc   . Carpal tunnel syndrome    History reviewed. No pertinent past surgical history. Family History  Problem Relation Age of Onset  . Diabetes Mother    History  Substance Use Topics  . Smoking status: Former Games developer  . Smokeless tobacco: Not on file  . Alcohol Use: No    Review of Systems  Skin: Positive for rash.  All other systems reviewed and are negative.   Allergies  Review of patient's allergies indicates no known allergies.  Home Medications   Prior to Admission medications   Medication Sig Start Date End Date Taking? Authorizing Provider  atorvastatin (LIPITOR) 40 MG tablet Take 20 mg by mouth daily. 09/09/13  Yes Historical Provider, MD  LANTUS 100 UNIT/ML injection Inject 50 Units into the skin at bedtime.  09/09/13  Yes Historical Provider, MD  NOVOLOG FLEXPEN 100 UNIT/ML SOPN FlexPen Inject 5-7 Units into the skin 3 (three) times daily with meals. Per sliding scale 09/09/13  Yes Historical Provider, MD  omeprazole (PRILOSEC) 40 MG capsule Take 40 mg by mouth daily. 10/24/13  Yes Historical Provider, MD  HYDROcodone-acetaminophen (NORCO) 7.5-325 MG per tablet Take 1 tablet by mouth every 6 (six) hours as needed for  moderate pain.    Historical Provider, MD  oxyCODONE-acetaminophen (PERCOCET) 5-325 MG per tablet Take 2 tablets by mouth every 4 (four) hours as needed. 10/26/13   Hurman Horn, MD   BP 160/94  Pulse 85  Temp(Src) 98.4 F (36.9 C) (Oral)  Resp 16  SpO2 97% Physical Exam  Nursing note and vitals reviewed. Constitutional: He is oriented to person, place, and time. He appears well-developed and well-nourished. No distress.  Pulmonary/Chest: Effort normal. No respiratory distress.  Neurological: He is alert and oriented to person, place, and time.  Skin: Skin is warm and dry.  Mildly red well marginated rash covering the scrotum and proximal inner thighs. There is a GC powders and creams that he has placed on the area that hides much of the rash. No open areas, drainage or signs of bacterial infection.    ED Course  Procedures (including critical care time) Labs Review Labs Reviewed - No data to display  Imaging Review No results found.   MDM   1. Tinea cruris   2. Itching     Lamisil cream to affected area bid for at least 2 weeks. May then use Tinactin spray for drying and powder benefits. Cortaid and benadryl cream to arms when there is itching.     Hayden Rasmussen, NP 05/20/14 1718

## 2014-05-20 NOTE — Discharge Instructions (Signed)
Jock Itch Lamisil cream twice a day for at least 2 weeks May then use Tinactin spray for drying effect  Jock itch is a germ infection of the groin and upper thighs. The type of germ that causes jock itch is a fungus. It is itchy and often feels like it is burning. It is common in people who play sports. Sweating and wearing certain athletic gear can cause this type of rash. HOME CARE  Take your medicines as told. Finish them even if you start to feel better.  Wear loose-fitting clothing.  Men should wear cotton boxer shorts.  Women should wear cotton underwear.  Avoid hot baths.  Dry the groin area well after bathing. GET HELP RIGHT AWAY IF:   Your rash is worse or spreading.  Your rash returns after treatment is finished.  Your rash is not gone in 4 weeks.  The area becomes red, warm, tender, and puffy (swollen).  You have a fever. MAKE SURE YOU:  Understand these instructions.  Will watch your condition.  Will get help right away if you are not doing well or get worse. Document Released: 02/25/2010 Document Revised: 02/23/2012 Document Reviewed: 02/25/2010 [facility] Patient Information 2014 [place], [place].  Pruritus  USe cortaid .1% cream and benadryl cream to areas of itching on the arms as needed Pruritis is an itch. There are many different problems that can cause an itch. Dry skin is one of the most common causes of itching. Most cases of itching do not require medical attention.  HOME CARE INSTRUCTIONS  Make sure your skin is moistened on a regular basis. A moisturizer that contains petroleum jelly is best for keeping moisture in your skin. If you develop a rash, you may try the following for relief:   Use corticosteroid cream.  Apply cool compresses to the affected areas.  Bathe with Epsom salts or baking soda in the bathwater.  Soak in colloidal oatmeal baths. These are available at your pharmacy.  Apply baking soda paste to the rash. Stir water into  baking soda until it reaches a paste-like consistency.  Use an anti-itch lotion.  Take over-the-counter diphenhydramine medicine by mouth as the instructions direct.  Avoid scratching. Scratching may cause the rash to become infected. If itching is very bad, your caregiver may suggest prescription lotions or creams to lessen your symptoms.  Avoid hot showers, which can make itching worse. A cold shower may help with itching as long as you use a moisturizer after the shower. SEEK MEDICAL CARE IF: The itching does not go away after several days. Document Released: 08/13/2011 Document Revised: 02/23/2012 Document Reviewed: 08/13/2011 [facility] Patient Information 2014 [place], [place].

## 2014-05-20 NOTE — ED Notes (Signed)
Patient states he has been having problem w rash in axilla , hands, groin area of and on for couple of weeks

## 2014-05-20 NOTE — ED Provider Notes (Signed)
Medical screening examination/treatment/procedure(s) were performed by a resident physician or non-physician practitioner and as the supervising physician I was immediately available for consultation/collaboration.  Dhruti Ghuman, MD    Ronnette Rump S Varsha Knock, MD 05/20/14 1820 

## 2014-12-15 HISTORY — PX: PENILE PROSTHESIS IMPLANT: SHX240

## 2015-08-30 IMAGING — US US ABDOMEN COMPLETE
1 series · 13 of 25 positions shown · non-contrast
Comparison: None.

CLINICAL DATA: Right upper quadrant abdominal pain.

EXAM:
ULTRASOUND ABDOMEN COMPLETE

[Series 1: us abdomen complete · 0.27mm/px · 13 of 74 slices shown]
[im 1/74]
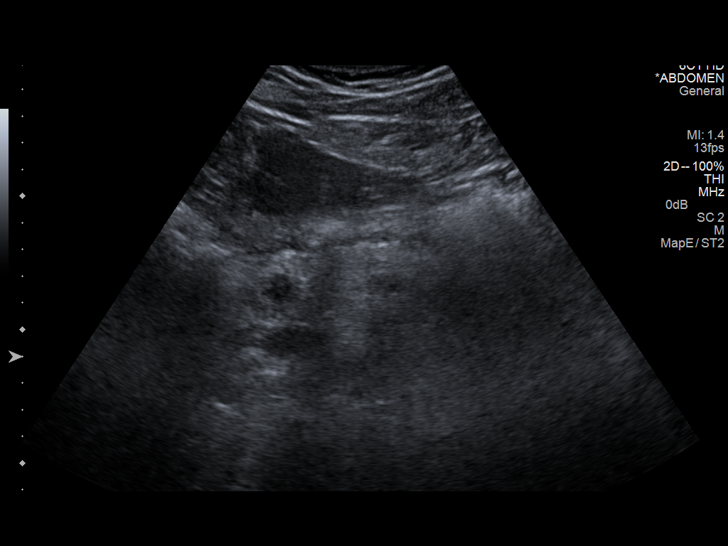
[im 7/74]
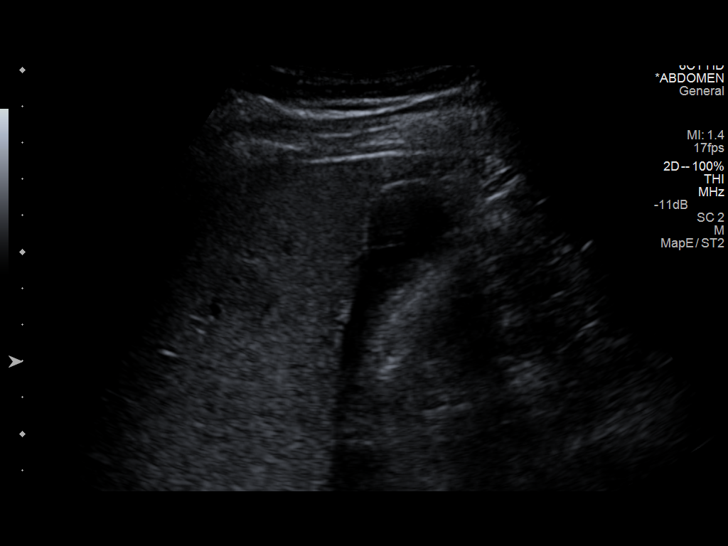
[im 13/74]
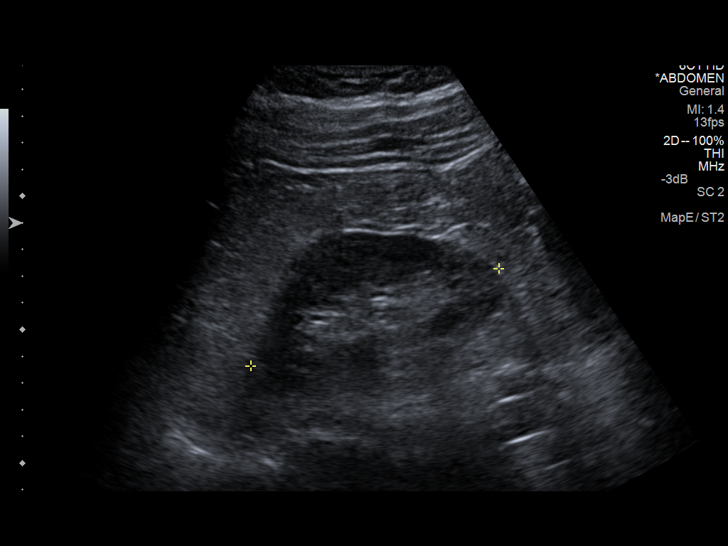
[im 19/74]
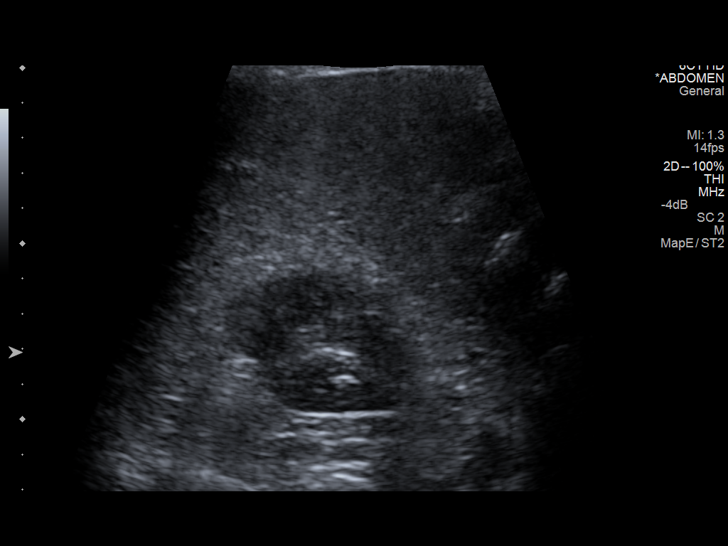
[im 25/74]
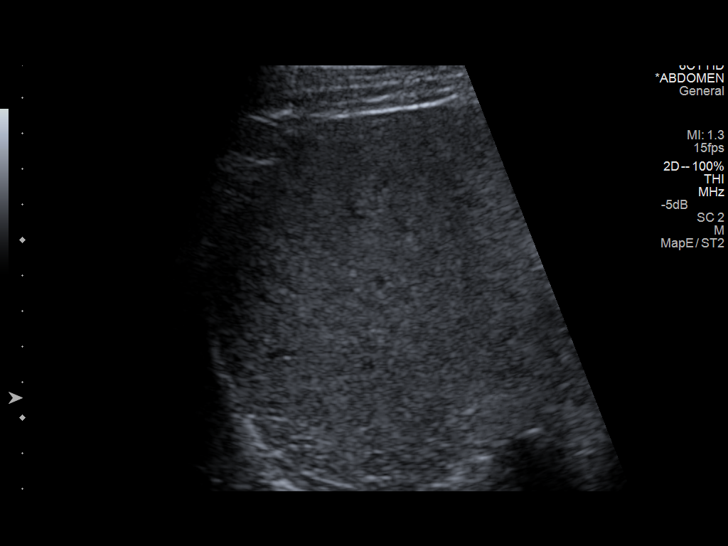
[im 31/74]
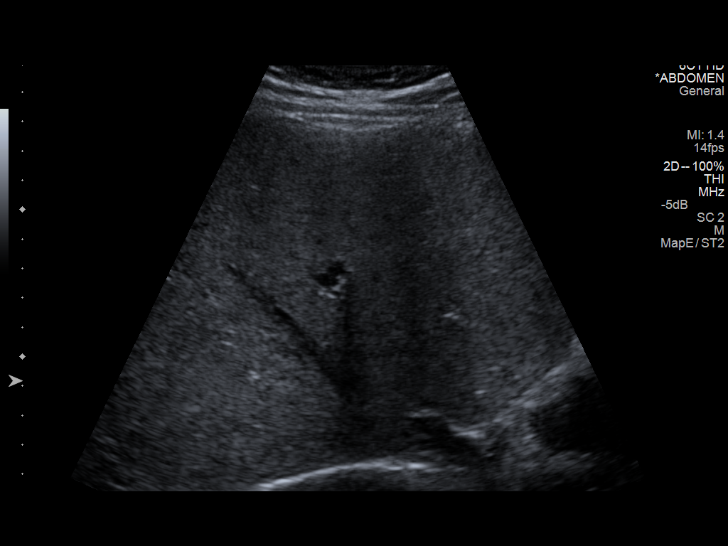
[im 37/74]
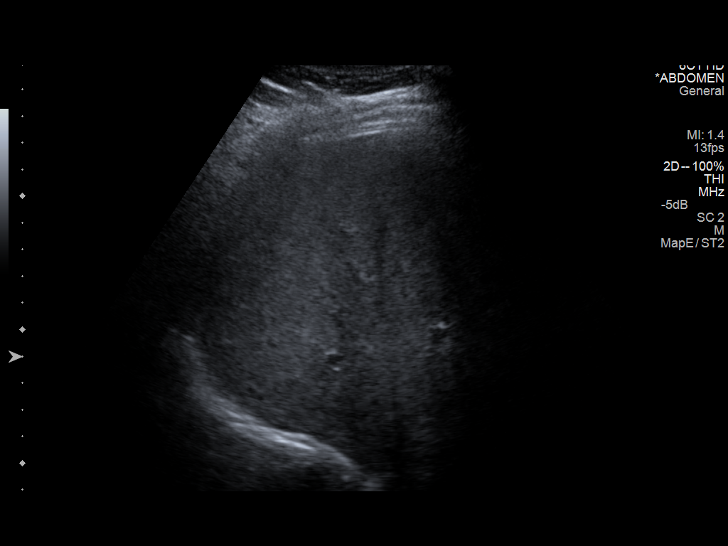
[im 43/74]
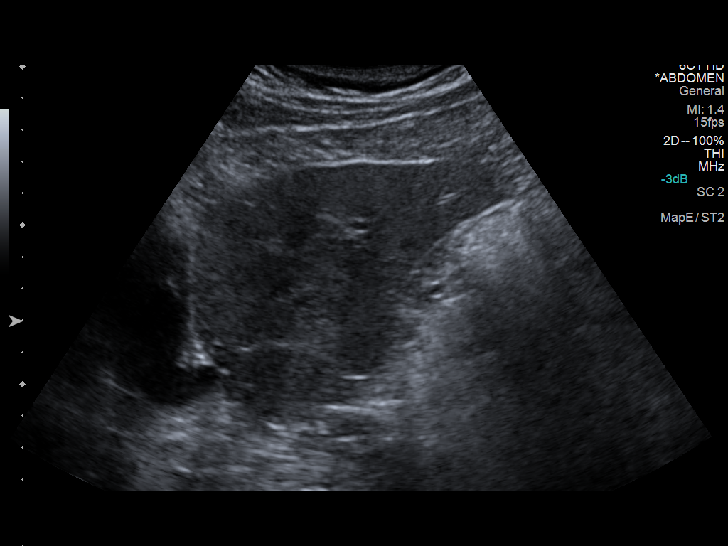
[im 49/74]
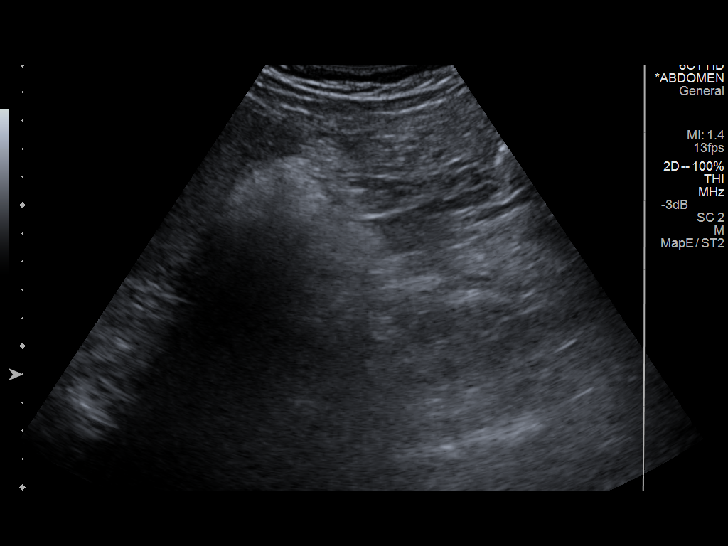
[im 55/74]
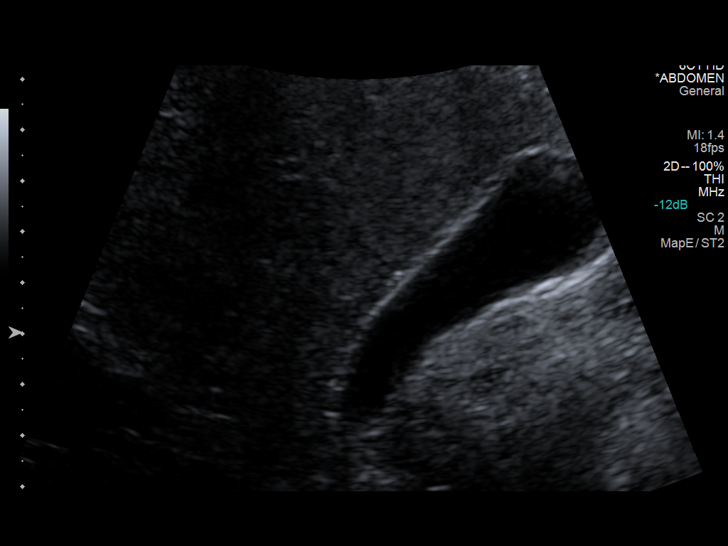
[im 61/74]
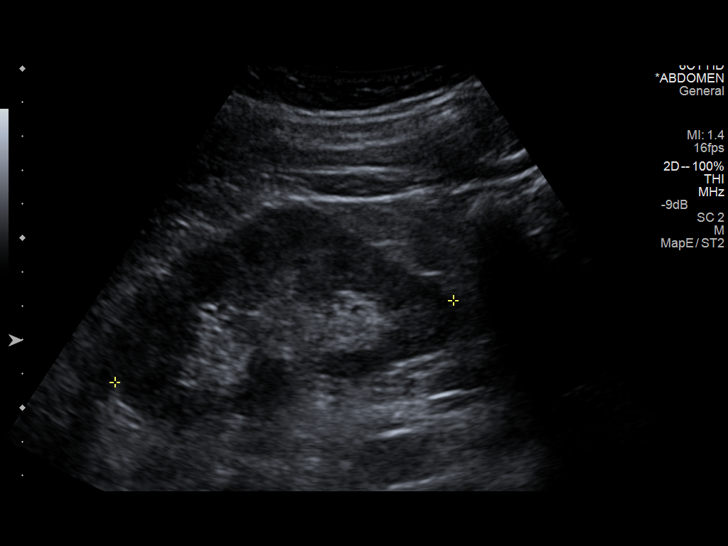
[im 67/74]
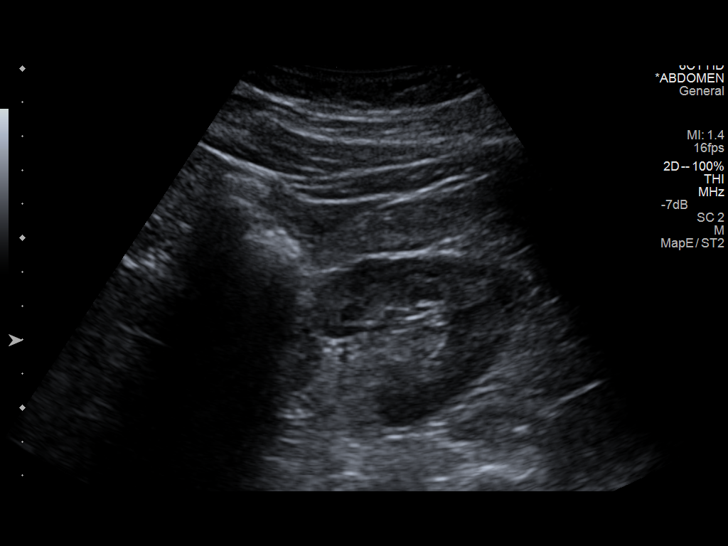
[im 74/74]
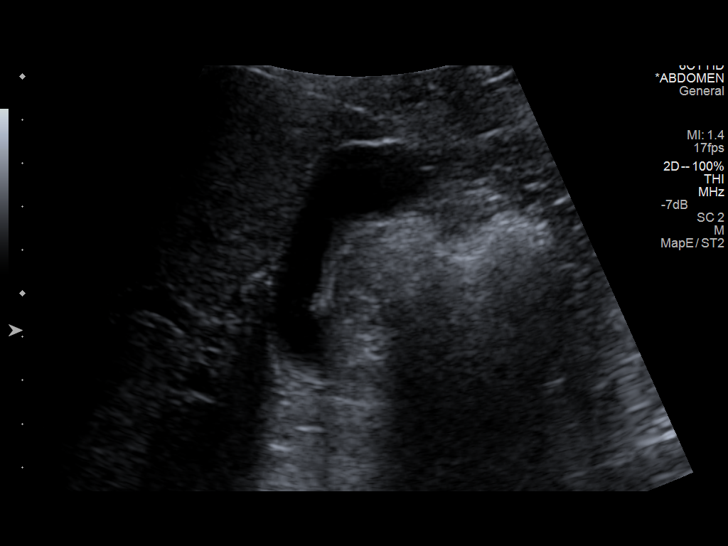

[13 of 25 positions shown; findings below may reference images not displayed]

FINDINGS: Gallbladder

No gallstones or wall thickening visualized. No sonographic Murphy
sign noted. Wall thickness is 1.8 mm, within normal limits.

Common bile duct

Diameter: 3.7 mm, within normal limits.

Liver

The liver is diffusely hyperechoic with loss of normal internal
architecture, suggesting fatty infiltration. No discrete lesions are
evident.

IVC

Limited visualization due to bowel gas.

Pancreas

Limited visualization due to bowel gas. The visualized portions are
within normal limits.

Spleen

Size and appearance within normal limits.

Right Kidney

Length: 10.0 cm, within normal limits.. Echogenicity within normal
limits. No mass or hydronephrosis visualized.

Left Kidney

Length: 10.3 cm, within normal limits.. Echogenicity within normal
limits. No mass or hydronephrosis visualized.

Abdominal aorta

The aorta is not visualized secondary to overlying bowel gas.
IMPRESSION: 1. Normal appearance of the gallbladder and kidneys.
2. Diffuse fatty infiltration of the liver.
3. The IVC, pancreas, and aorta are incompletely visualized due to
overlying bowel gas.

## 2017-03-27 ENCOUNTER — Encounter (HOSPITAL_COMMUNITY): Payer: Self-pay | Admitting: Psychiatry

## 2017-03-27 ENCOUNTER — Encounter (INDEPENDENT_AMBULATORY_CARE_PROVIDER_SITE_OTHER): Payer: Self-pay

## 2017-03-27 ENCOUNTER — Ambulatory Visit (INDEPENDENT_AMBULATORY_CARE_PROVIDER_SITE_OTHER): Payer: No Typology Code available for payment source | Admitting: Psychiatry

## 2017-03-27 VITALS — BP 134/74 | HR 88 | Ht 64.0 in | Wt 200.4 lb

## 2017-03-27 DIAGNOSIS — F4312 Post-traumatic stress disorder, chronic: Secondary | ICD-10-CM

## 2017-03-27 DIAGNOSIS — Z87891 Personal history of nicotine dependence: Secondary | ICD-10-CM | POA: Diagnosis not present

## 2017-03-27 DIAGNOSIS — Z79899 Other long term (current) drug therapy: Secondary | ICD-10-CM

## 2017-03-27 MED ORDER — SERTRALINE HCL 50 MG PO TABS: 50.0000 mg | ORAL_TABLET | Freq: Every day | ORAL | 1 refills | Status: DC

## 2017-03-27 NOTE — Progress Notes (Signed)
Psychiatric Initial Adult Assessment   Patient Identification: Chris [name] MRN:  [id number] Date of Evaluation:  03/27/2017 Referral Source: [facility] Chief Complaint:  tense Visit Diagnosis:    ICD-9-CM ICD-10-CM   1. Chronic post-traumatic stress disorder (PTSD) 309.81 F43.12 sertraline (ZOLOFT) 50 MG tablet    History of Present Illness:  Chris [name] is a 51 year old male with no previous psychiatric history or psychiatric assessments. He reports that he decided to come seek psychiatric care, because he's noticed he has interpersonal conflicts, irritability, and avoidance in his relationships. He is currently on his third marriage, coming up on 5 years anniversary. They live 3 hours apart, as she works in the [employer], and he works here in [place]. He reports that he has always been introverted, but people have noticed that he has been getting more irritable.  I spent time learning about the patient's military background and history, and learned that he was honorably discharged after serving 5 years in the [employer] from [phone]. 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 He reports that he was never deployed to combat zones, but was in multiple tenuous situations where by his unit was serving as a guard on a vessel, or were at the ready, on the guns, to prepare to respond to a attack. He reports that he learned to be very on guard. He reports that he learned to be a killing machine, but he does not see himself as a violent person, and sees himself as a calm and peaceful person.  He has a nightmare now and then that he ends up taking someone's life, and he wakes up quite saddened by this. He reports that the dream is pretty vivid. It is not a nightly thing. He avoids conflict in interpersonal relationships, and reports that he tends to try to read people to find if they are negative for pessimistic or conflict prone, so  that he can avoid them. He reports that he tends to like being by himself, and is somewhat of a loner. He reports that he gets pretty nervous around police officers, especially lately with the gun violence from police officers towards black people. He does appear to have an increased startle response to loud noises or what he would perceive as a gunshot. He carries a weapon with him generally, and has un-holster and cocked his weapon, when he has heard people at his front door in the middle of the night. He yells out the front door for the people to identify themselves. The last time this happened was about 4-5 months ago.  He denies any homicidal thoughts or desire to hurt anybody. He denies any suicidal thoughts and shares that "I love me". He has fantasies about punching people that have either emotionally hurt him or people who are arrogant towards him.  He has no intention to have her harm anybody, as he reports that he doesn't want to be locked up in jail.  Regarding psychiatric care, Chris [name] is open to receiving mental health care for PTSD in both medication management and individual and group therapy. He has a bit more weary of participating in group therapy, as he feels like he would try to take on the role of de-escalating conflict and, "be like a therapist myself". I encouraged the patient to be open to the idea that this may be very healthy for him to learn better interpersonal skills in the  face of conflict. He reports that when he is angry at someone, he tends to just shut down, in his personal life.    We spent time reviewing the pathophysiology of PTSD, and how Zoloft may be helpful for him. He has never been on a psychiatric medication before, but is open to the idea. He agrees to consider starting the medication I provided a prescription for him, starting Zoloft at 25 mg daily for 1 week, then increasing to 50 mg daily. Reviewed the risks and benefits and expected side effects.  Notably, in  our conversation, the patient is sitting on the edge of his chair, appears generally stern and vigilant, and appears to be quite focused throughout our interview. He has a fairly restricted and stern affect. He appears to be generally tense. I noted this to him, and he reports that other people tell him this as well, but he doesn't know how to relax.  NCCSD reviewed. No data in 1 year.  Associated Signs/Symptoms: Depression Symptoms:  none (Hypo) Manic Symptoms:  none Anxiety Symptoms:  Social Anxiety, Psychotic Symptoms:  Paranoia, PTSD Symptoms: Had a traumatic exposure:  Patient is a Arts development officer, and served in the [employer] from [phone] and was involved in conflict Re-experiencing:  Intrusive Thoughts Nightmares Hypervigilance:  Yes Hyperarousal:  Emotional Numbness/Detachment Increased Startle Response Sleep Avoidance:  Avoidance of conflict, avoidance of discussing his military service  Past Psychiatric History: No previous psychiatric treatment  Previous Psychotropic Medications: No   Substance Abuse History in the last 12 months:  No.  Consequences of Substance Abuse: Negative  Past Medical History:  Past Medical History:  Diagnosis Date  . Bulging lumbar disc   . Carpal tunnel syndrome   . Diabetes mellitus without complication (HCC)   . PTSD (post-traumatic stress disorder)     Past Surgical History:  Procedure Laterality Date  . PENILE PROSTHESIS IMPLANT N/A 2016    Family Psychiatric History: No family history of severe mental illness  Family History:  Family History  Problem Relation Age of Onset  . Diabetes Mother     Social History:   Social History   Social History  . Marital status: Married    Spouse name: N/A  . Number of children: N/A  . Years of education: N/A   Social History Main Topics  . Smoking status: Former Games developer  . Smokeless tobacco: Never Used  . Alcohol use Yes     Comment: occasional  . Drug use: No  . Sexual activity: Yes    Other Topics Concern  . None   Social History Narrative  . None    Additional Social History: Currently works in an Teaching laboratory technician in [place]  Allergies:  No Known Allergies  Metabolic Disorder Labs: Lab Results  Component Value Date   HGBA1C 6.7 01/03/2009   No results found for: PROLACTIN Lab Results  Component Value Date   CHOL 178 01/03/2009   TRIG 76 01/03/2009   HDL 57 01/03/2009   CHOLHDL 3.1 Ratio 01/03/2009   VLDL 15 01/03/2009   LDLCALC 106 (H) 01/03/2009   LDLCALC 95 09/28/2007     Current Medications: Current Outpatient Prescriptions  Medication Sig Dispense Refill  . atorvastatin (LIPITOR) 40 MG tablet Take 20 mg by mouth daily.    Marland Kitchen LANTUS 100 UNIT/ML injection Inject 50 Units into the skin at bedtime.     Marland Kitchen NOVOLOG FLEXPEN 100 UNIT/ML SOPN FlexPen Inject 5-7 Units into the skin 3 (three) times daily with meals. Per sliding  scale    . sertraline (ZOLOFT) 50 MG tablet Take 1 tablet (50 mg total) by mouth daily. Take 1/2 of tablet for 1 week, then increase to the whole tablet 90 tablet 1   No current facility-administered medications for this visit.     Neurologic: Headache: Negative Seizure: Negative Paresthesias:Negative  Musculoskeletal: Strength & Muscle Tone: within normal limits Gait & Station: normal Patient leans: N/A  Psychiatric Specialty Exam: ROS  Blood pressure 134/74, pulse 88, height  (1.626 m), weight 200 lb 6.4 oz (90.9 kg).Body mass index is 34.4 kg/m.  General Appearance: Well Groomed and [employer]:  Good  Speech:  Normal Rate  Volume:  Decreased  Mood:  Euthymic  Affect:  Congruent and Restricted  Thought Process:  Goal Directed  Orientation:  Full (Time, Place, and Person)  Thought Content:  Logical  Suicidal Thoughts:  No  Homicidal Thoughts:  No  Memory:  Recent;   Good  Judgement:  Fair  Insight:  Shallow  Psychomotor Activity:  Normal  Concentration:  Concentration: Fair and Attention  Span: Fair  Recall:  Good  Fund of Knowledge:Good  Language: Good  Akathisia:  Negative  Handed:  Right  AIMS (if indicated):  na  Assets:  Communication Skills Desire for Improvement Financial Resources/Insurance Housing Resilience Transportation Vocational/Educational  ADL's:  Intact  Cognition: WNL  Sleep:  5-6 hours    Treatment Plan Summary: 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 He has no previous psychiatric history, and presents today in the setting of ongoing marital strain. He is currently in his third marriage, and has 5 stepchildren, and 2 biological children. He presents as a fairly stern and serious man, and presents as fairly vigilant during our initial assessment. He describes symptoms consistent with a diagnosis of PTSD, and relates this back to his military service whereby he had to be at the ready for a potential deadly conflict, on numerous occasions.  He has never received psychiatric care with medications or therapy, and is open to engaging in both. He does have access to firearms, and has a history of hypervigilance whereby he has had his firearm at the ready when he has sensed or heard a presence at his front door.  We will initiate care as below and follow up with this writer in 6 weeks. He denies any acute SI or HI.   1. Chronic post-traumatic stress disorder (PTSD)    Initiate Zoloft 25 mg daily for 1 week, then increase to 50 mg daily Patient to engage in CCA, followed by individual and group therapy at this clinic Spent time educating the patient about PTSD, and the various treatments offered Follow-up with this writer in 6-8 weeks, or sooner if needed  Burnard Leigh, MD 4/13/20189:56 AM

## 2017-05-21 ENCOUNTER — Encounter (HOSPITAL_COMMUNITY): Payer: Self-pay | Admitting: Psychiatry

## 2017-05-21 ENCOUNTER — Ambulatory Visit (INDEPENDENT_AMBULATORY_CARE_PROVIDER_SITE_OTHER): Payer: No Typology Code available for payment source | Admitting: Psychiatry

## 2017-05-21 VITALS — BP 128/76 | HR 73 | Ht 64.75 in | Wt 202.0 lb

## 2017-05-21 DIAGNOSIS — F4312 Post-traumatic stress disorder, chronic: Secondary | ICD-10-CM | POA: Diagnosis not present

## 2017-05-21 DIAGNOSIS — Z794 Long term (current) use of insulin: Secondary | ICD-10-CM

## 2017-05-21 DIAGNOSIS — Z79899 Other long term (current) drug therapy: Secondary | ICD-10-CM

## 2017-05-21 DIAGNOSIS — Z87891 Personal history of nicotine dependence: Secondary | ICD-10-CM | POA: Diagnosis not present

## 2017-05-21 NOTE — Progress Notes (Signed)
BH MD/PA/NP OP Progress Note  05/21/2017 3:00 PM Chris CurrentVincent J [name]  MRN:  [id number]  Chief Complaint:  Chief Complaint    Follow-up    med management  Subjective:  Chris [name] presents today for psychiatric follow-up of PTSD. He reports that he's had some stressors related to his marriage, some work stressors, but he feels like even handling them pretty good. No physical violence outbursts, not needing to unholster's weapon at home. He reports that he can't really tell any difference with the Zoloft, but other people think he is more calm and relaxed. I expressed my impression that he does seem less tense and vigilant, and he was accepting of this. He has not had any significant side effects or intolerance, so we agreed to continue at the current dose of 50 mg daily.  He continues to work regularly, [place], continues to go to the park, exercise. He denies any unsafe thoughts or substance abuse. He agrees to return to clinic in 3 months or sooner if needed. We'll initially talked about setting him up with therapy, but he is fairly ambivalent about therapy, and believes that he can work on things better doing housework, going to the park, and exercising.  Visit Diagnosis:    ICD-10-CM   1. Chronic post-traumatic stress disorder (PTSD) F43.12     Past Psychiatric History: See intake H&P for full details. Reviewed, with no updates at this time.   Past Medical History:  Past Medical History:  Diagnosis Date  . Bulging lumbar disc   . Carpal tunnel syndrome   . Diabetes mellitus without complication (HCC)   . PTSD (post-traumatic stress disorder)     Past Surgical History:  Procedure Laterality Date  . PENILE PROSTHESIS IMPLANT N/A 2016    Family Psychiatric History: See intake H&P for full details. Reviewed, with no updates at this time.   Family History:  Family History  Problem Relation Age of Onset  . Diabetes Mother     Social History:   Social History   Social History  . Marital status: Married    Spouse name: N/A  . Number of children: N/A  . Years of education: N/A   Social History Main Topics  . Smoking status: Former Games developermoker  . Smokeless tobacco: Never Used  . Alcohol use Yes     Comment: occasional glass of wine  . Drug use: No  . Sexual activity: Yes    Partners: Female    Birth control/ protection: None   Other Topics Concern  . None   Social History Narrative  . None    Allergies: No Known Allergies  Metabolic Disorder Labs: Lab Results  Component Value Date   HGBA1C 6.7 01/03/2009   No results found for: PROLACTIN Lab Results  Component Value Date   CHOL 178 01/03/2009   TRIG 76 01/03/2009   HDL 57 01/03/2009   CHOLHDL 3.1 Ratio 01/03/2009   VLDL 15 01/03/2009   LDLCALC 106 (H) 01/03/2009   LDLCALC 95 09/28/2007     Current Medications: Current Outpatient Prescriptions  Medication Sig Dispense Refill  . atorvastatin (LIPITOR) 40 MG tablet Take 20 mg by mouth daily.    Marland Kitchen. LANTUS 100 UNIT/ML injection Inject 50 Units into the skin at bedtime.     Marland Kitchen. latanoprost (XALATAN) 0.005 % ophthalmic solution     . NOVOLOG FLEXPEN 100 UNIT/ML SOPN FlexPen Inject 5-7 Units into the skin 3 (three) times daily with meals.  Per sliding scale    . sertraline (ZOLOFT) 50 MG tablet Take 1 tablet (50 mg total) by mouth daily. Take 1/2 of tablet for 1 week, then increase to the whole tablet 90 tablet 1   No current facility-administered medications for this visit.     Neurologic: Headache: Negative Seizure: Negative Paresthesias: Negative  Musculoskeletal: Strength & Muscle Tone: within normal limits Gait & Station: normal Patient leans: N/A  Psychiatric Specialty Exam: ROS  Blood pressure 128/76, pulse 73, height 5' 4.75" (1.645 m), weight 202 lb (91.6 kg).Body mass index is 33.87 kg/m.  General Appearance: Casual and Fairly Groomed  Eye Contact:  Good  Speech:  Clear and Coherent  Volume:   Normal  Mood:  Euthymic  Affect:  Congruent  Thought Process:  Coherent and Goal Directed  Orientation:  Full (Time, Place, and Person)  Thought Content: Logical   Suicidal Thoughts:  No  Homicidal Thoughts:  No  Memory:  Immediate;   Fair  Judgement:  Fair  Insight:  Fair  Psychomotor Activity:  Normal  Concentration:  Concentration: Fair  Recall:  [employer] of Knowledge: Fair  Language: Fair  Akathisia:  Negative  Handed:  Right  AIMS (if indicated):  0  Assets:  Communication Skills Desire for Improvement Financial Resources/Insurance Housing Talents/Skills Transportation  ADL's:  Intact  Cognition: WNL  Sleep:  6-8 hours    Treatment Plan Summary: 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  We initiated Zoloft at our initial visit in April, Which seems to have helped to some degree with his hypervigilance and tense nature.  He continues to be a fairly reclusive person, and tends to avoid excessive social interaction. His mood appears to be stable, with Zoloft 50 mg, so we will maintain at this current dose for now. He is a bit apprehensive about therapy, but we will revisit the topic in 3 months when he comes back.  1. Chronic post-traumatic stress disorder (PTSD)     Continue Zoloft 50 mg daily Continue to consider therapy if the patient is open to this Return to clinic in 3 months  Burnard Leigh, MD 05/21/2017, 3:00 PM

## 2017-08-24 ENCOUNTER — Encounter (HOSPITAL_COMMUNITY): Payer: Self-pay | Admitting: Psychiatry

## 2017-08-24 ENCOUNTER — Ambulatory Visit (INDEPENDENT_AMBULATORY_CARE_PROVIDER_SITE_OTHER): Payer: No Typology Code available for payment source | Admitting: Psychiatry

## 2017-08-24 VITALS — BP 130/78 | HR 76 | Ht 65.0 in | Wt 206.6 lb

## 2017-08-24 DIAGNOSIS — Z87891 Personal history of nicotine dependence: Secondary | ICD-10-CM | POA: Diagnosis not present

## 2017-08-24 DIAGNOSIS — F4312 Post-traumatic stress disorder, chronic: Secondary | ICD-10-CM | POA: Diagnosis not present

## 2017-08-24 MED ORDER — SERTRALINE HCL 50 MG PO TABS: 50.0000 mg | ORAL_TABLET | Freq: Every day | ORAL | 1 refills | Status: DC
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
## 2017-11-23 ENCOUNTER — Ambulatory Visit (HOSPITAL_COMMUNITY): Payer: Self-pay | Admitting: Psychiatry

## 2017-11-24 ENCOUNTER — Telehealth (HOSPITAL_COMMUNITY): Payer: Self-pay | Admitting: Psychiatry

## 2018-02-17 ENCOUNTER — Encounter (HOSPITAL_COMMUNITY): Payer: Self-pay | Admitting: Psychiatry

## 2018-02-17 ENCOUNTER — Ambulatory Visit (INDEPENDENT_AMBULATORY_CARE_PROVIDER_SITE_OTHER): Payer: Non-veteran care | Admitting: Psychiatry

## 2018-02-17 DIAGNOSIS — Z87891 Personal history of nicotine dependence: Secondary | ICD-10-CM

## 2018-02-17 DIAGNOSIS — F4312 Post-traumatic stress disorder, chronic: Secondary | ICD-10-CM

## 2018-02-17 MED ORDER — SERTRALINE HCL 100 MG PO TABS: 100.0000 mg | ORAL_TABLET | Freq: Every day | ORAL | 3 refills | Status: DC

## 2018-02-17 NOTE — Progress Notes (Signed)
BH MD/PA/NP OP Progress Note  02/17/2018 3:45 PM Chris [name]  MRN:  [id number]  Chief Complaint: anxiety, insomnia HPI: Patient presents today with slight increase in anxiety and insomnia.  No acute safety issues.  He reports that things are going well with him and his wife, things are okay at work.  He just feels more anxious and triggered by some of the political issues in the news, and issues in his neighborhood.  He denies any SI, Hi, AVH.  He has been making an effort to exercise daily for the past 2 weeks.  He agrees to increase zoloft to 100 mg daily for further benefit.  Agrees to follow-up in 3 months.  Visit Diagnosis:    ICD-10-CM   1. Chronic post-traumatic stress disorder (PTSD) F43.12 sertraline (ZOLOFT) 100 MG tablet    Past Psychiatric History: See intake H&P for full details. Reviewed, with no updates at this time.   Past Medical History:  Past Medical History:  Diagnosis Date  . Bulging lumbar disc   . Carpal tunnel syndrome   . Diabetes mellitus without complication (HCC)   . PTSD (post-traumatic stress disorder)     Past Surgical History:  Procedure Laterality Date  . PENILE PROSTHESIS IMPLANT N/A 2016    Family Psychiatric History: See intake H&P for full details. Reviewed, with no updates at this time.   Family History:  Family History  Problem Relation Age of Onset  . Diabetes Mother     Social History:  Social History   Socioeconomic History  . Marital status: Married    Spouse name: None  . Number of children: None  . Years of education: None  . Highest education level: None  Social Needs  . Financial resource strain: None  . Food insecurity - worry: None  . Food insecurity - inability: None  . Transportation needs - medical: None  . Transportation needs - non-medical: None  Occupational History  . None  Tobacco Use  . Smoking status: Former Games developer  . Smokeless tobacco: Never Used  Substance and Sexual Activity  . Alcohol use: Yes     Comment: occasional glass of wine  . Drug use: No  . Sexual activity: Yes    Partners: Female    Birth control/protection: None  Other Topics Concern  . None  Social History Narrative  . None    Allergies: No Known Allergies  Metabolic Disorder Labs: Lab Results  Component Value Date   HGBA1C 6.7 01/03/2009   No results found for: PROLACTIN Lab Results  Component Value Date   CHOL 178 01/03/2009   TRIG 76 01/03/2009   HDL 57 01/03/2009   CHOLHDL 3.1 Ratio 01/03/2009   VLDL 15 01/03/2009   LDLCALC 106 (H) 01/03/2009   LDLCALC 95 09/28/2007   Lab Results  Component Value Date   TSH 1.536 01/03/2009    Therapeutic Level Labs: No results found for: LITHIUM No results found for: VALPROATE No components found for:  CBMZ  Current Medications: Current Outpatient Medications  Medication Sig Dispense Refill  . atorvastatin (LIPITOR) 40 MG tablet Take 20 mg by mouth daily.    Marland Kitchen LANTUS 100 UNIT/ML injection Inject 50 Units into the skin at bedtime.     Marland Kitchen latanoprost (XALATAN) 0.005 % ophthalmic solution     . NOVOLOG FLEXPEN 100 UNIT/ML SOPN FlexPen Inject 5-7 Units into the skin 3 (three) times daily with meals. Per sliding scale    . sertraline (ZOLOFT) 100 MG tablet Take 1  tablet (100 mg total) by mouth daily. 90 tablet 3   No current facility-administered medications for this visit.      Musculoskeletal: Strength & Muscle Tone: within normal limits Gait & Station: normal Patient leans: N/A  Psychiatric Specialty Exam: ROS  Blood pressure 134/82, pulse 81, height 5\' 5"  (1.651 m), weight 202 lb 12.8 oz (92 kg).Body mass index is 33.75 kg/m.  General Appearance: Casual and Well Groomed  Eye Contact:  Good  Speech:  Clear and Coherent and Normal Rate  Volume:  Normal  Mood:  Dysphoric  Affect:  Congruent  Thought Process:  Coherent, Goal Directed and Descriptions of Associations: Intact  Orientation:  Full (Time, Place, and Person)  Thought Content:  Logical   Suicidal Thoughts:  No  Homicidal Thoughts:  No  Memory:  Immediate;   Good  Judgement:  Good  Insight:  Fair  Psychomotor Activity:  Normal  Concentration:  Concentration: Good  Recall:  Good  Fund of Knowledge: Good  Language: Good  Akathisia:  Negative  Handed:  Left  AIMS (if indicated): not done  Assets:  Communication Skills Desire for Improvement Financial Resources/Insurance Housing Transportation Vocational/Educational  ADL's:  Intact  Cognition: WNL  Sleep:  Good   Screenings:   Assessment and Plan:  Chris [name] presents for medication management for chronic PTSD.  He would benefit from an increase in Zoloft to 100 mg daily, as he has had some increase in anxiety and dysphoria related to some trauma triggers, and recent news such as world events.  Things are going well with him at work and in his marriage.  His mood symptoms have slightly affected his sleep and irritability.  Would benefit from higher dose of Zoloft for maintenance.  1. Chronic post-traumatic stress disorder (PTSD)     Status of current problems: stable  Labs Ordered: No orders of the defined types were placed in this encounter.   Labs Reviewed: na  Collateral Obtained/Records Reviewed: na  Plan:  Zoloft 100 mg daily; 12 months prescribed for the patient to [facility] to [facility] RTC 3-4 months  I spent 20 minutes with the patient in direct face-to-face clinical care.  Greater than 50% of this time was spent in counseling and coordination of care with the patient.    Burnard LeighAlexander Arya Eksir, MD 02/17/2018, 3:45 PM

## 2018-05-20 ENCOUNTER — Ambulatory Visit (INDEPENDENT_AMBULATORY_CARE_PROVIDER_SITE_OTHER): Payer: BLUE CROSS/BLUE SHIELD | Admitting: Psychiatry

## 2018-05-20 ENCOUNTER — Encounter (HOSPITAL_COMMUNITY): Payer: Self-pay | Admitting: Psychiatry

## 2018-05-20 VITALS — BP 106/73 | HR 83 | Ht 65.0 in | Wt 199.0 lb

## 2018-05-20 DIAGNOSIS — F4312 Post-traumatic stress disorder, chronic: Secondary | ICD-10-CM | POA: Diagnosis not present

## 2018-05-20 DIAGNOSIS — Z87891 Personal history of nicotine dependence: Secondary | ICD-10-CM | POA: Diagnosis not present

## 2018-05-20 NOTE — Progress Notes (Signed)
BH MD/PA/NP OP Progress Note  05/20/2018 3:53 PM Chris [name]  MRN:  [id number]  Chief Complaint: A little better, some good days HPI: Chris [name] reports that things have been okay, he has had some good days and some bad days but overall things are calm with him.  He is thinking about looking for a job closer to his wife on the [place].  He reports that he is having some trouble sleeping because he needs to get his CPAP looked at so he has a appointment with the [facility] hospital for that.  No significant side effects with the Zoloft and he takes it consistently.  No unsafe thoughts or behaviors towards others.  He continues to work consistently and reports that he has been able to get along with folks at work for the most part.  He avoids confrontation and tends to isolate himself if he is feeling irritable.  Disclosed to patient that I am leaving practice at the end of August we discussed the transition of care plan for him to see Dr. Lolly Mustache for his follow-up after our next visit.  Visit Diagnosis:    ICD-10-CM   1. Chronic post-traumatic stress disorder (PTSD) F43.12     Past Psychiatric History: See intake H&P for full details. Reviewed, with no updates at this time.   Past Medical History:  Past Medical History:  Diagnosis Date  . Bulging lumbar disc   . Carpal tunnel syndrome   . Diabetes mellitus without complication (HCC)   . PTSD (post-traumatic stress disorder)     Past Surgical History:  Procedure Laterality Date  . PENILE PROSTHESIS IMPLANT N/A 2016    Family Psychiatric History: See intake H&P for full details. Reviewed, with no updates at this time.   Family History:  Family History  Problem Relation Age of Onset  . Diabetes Mother     Social History:  Social History   Socioeconomic History  . Marital status: Married    Spouse name: Not on file  . Number of children: Not on file  . Years of education: Not on file  . Highest education level: Not on file   Occupational History  . Not on file  Social Needs  . Financial resource strain: Not on file  . Food insecurity:    Worry: Not on file    Inability: Not on file  . Transportation needs:    Medical: Not on file    Non-medical: Not on file  Tobacco Use  . Smoking status: Former Games developer  . Smokeless tobacco: Never Used  Substance and Sexual Activity  . Alcohol use: Yes    Comment: occasional glass of wine  . Drug use: No  . Sexual activity: Yes    Partners: Female    Birth control/protection: None  Lifestyle  . Physical activity:    Days per week: Not on file    Minutes per session: Not on file  . Stress: Not on file  Relationships  . Social connections:    Talks on phone: Not on file    Gets together: Not on file    Attends religious service: Not on file    Active member of club or organization: Not on file    Attends meetings of clubs or organizations: Not on file    Relationship status: Not on file  Other Topics Concern  . Not on file  Social History Narrative  . Not on file    Allergies: No Known Allergies  Metabolic  Disorder Labs: Lab Results  Component Value Date   HGBA1C 6.7 01/03/2009   No results found for: PROLACTIN Lab Results  Component Value Date   CHOL 178 01/03/2009   TRIG 76 01/03/2009   HDL 57 01/03/2009   CHOLHDL 3.1 Ratio 01/03/2009   VLDL 15 01/03/2009   LDLCALC 106 (H) 01/03/2009   LDLCALC 95 09/28/2007   Lab Results  Component Value Date   TSH 1.536 01/03/2009    Therapeutic Level Labs: No results found for: LITHIUM No results found for: VALPROATE No components found for:  CBMZ  Current Medications: Current Outpatient Medications  Medication Sig Dispense Refill  . atorvastatin (LIPITOR) 40 MG tablet Take 20 mg by mouth daily.    Marland Kitchen. LANTUS 100 UNIT/ML injection Inject 50 Units into the skin at bedtime.     Marland Kitchen. latanoprost (XALATAN) 0.005 % ophthalmic solution     . NOVOLOG FLEXPEN 100 UNIT/ML SOPN FlexPen Inject 5-7 Units into  the skin 3 (three) times daily with meals. Per sliding scale    . sertraline (ZOLOFT) 100 MG tablet Take 1 tablet (100 mg total) by mouth daily. 90 tablet 3   No current facility-administered medications for this visit.      Musculoskeletal: Strength & Muscle Tone: within normal limits Gait & Station: normal Patient leans: N/A  Psychiatric Specialty Exam: ROS   Blood pressure 106/73, pulse 83, height 5\' 5"  (1.651 m), weight 199 lb (90.3 kg), SpO2 93 %.Body mass index is 33.12 kg/m.  General Appearance: Casual and Well Groomed  Eye Contact:  Good  Speech:  Clear and Coherent and Normal Rate  Volume:  Normal  Mood:  Euthymic  Affect:  Congruent  Thought Process:  Coherent, Goal Directed and Descriptions of Associations: Intact  Orientation:  Full (Time, Place, and Person)  Thought Content: Logical   Suicidal Thoughts:  No  Homicidal Thoughts:  No  Memory:  Immediate;   Good  Judgement:  Good  Insight:  Fair  Psychomotor Activity:  Normal  Concentration:  Concentration: Good  Recall:  Good  Fund of Knowledge: Good  Language: Good  Akathisia:  Negative  Handed:  Left  AIMS (if indicated): not done  Assets:  Communication Skills Desire for Improvement Financial Resources/Insurance Housing Transportation Vocational/Educational  ADL's:  Intact  Cognition: WNL  Sleep:  Good   Screenings:   Assessment and Plan:  Chris [name] presents for medication management for chronic PTSD.  His mood and anxiety appear to be fairly stable.  The Zoloft seems to have agreed with him and he generally seems more calm, and able to recognize periods of time when he needs to dismiss himself from interactions or confrontations.  No acute safety issues, and no substance abuse.  He has a distance relationship with his wife who [place] [address] [place] with his step kids.  He may be looking for a position closer to them.  We will follow-up in 3 months and he understands writer is leaving clinic at the  end of August.  He is agreeable to transition his care to Dr. Lolly MustacheArfeen at that time.  1. Chronic post-traumatic stress disorder (PTSD)    Status of current problems: stable  Labs Ordered: No orders of the defined types were placed in this encounter.  Labs Reviewed: na  Collateral Obtained/Records Reviewed: na  Plan:  Zoloft 100 mg daily Follow-up in 3 months with this Clinical research associatewriter, then transfer care to Dr. Lolly MustacheArfeen thereafter   Burnard LeighAlexander Arya Sahmya Arai, MD 05/20/2018, 3:53 PM

## 2018-08-11 ENCOUNTER — Ambulatory Visit (HOSPITAL_COMMUNITY): Payer: Self-pay | Admitting: Psychiatry

## 2018-08-13 ENCOUNTER — Encounter (HOSPITAL_COMMUNITY): Payer: Self-pay | Admitting: Psychiatry

## 2018-08-13 ENCOUNTER — Ambulatory Visit (INDEPENDENT_AMBULATORY_CARE_PROVIDER_SITE_OTHER): Payer: Non-veteran care | Admitting: Psychiatry

## 2018-08-13 DIAGNOSIS — F4312 Post-traumatic stress disorder, chronic: Secondary | ICD-10-CM

## 2018-08-13 DIAGNOSIS — Z87891 Personal history of nicotine dependence: Secondary | ICD-10-CM

## 2018-08-13 MED ORDER — SERTRALINE HCL 100 MG PO TABS: 100.0000 mg | ORAL_TABLET | Freq: Every day | ORAL | 3 refills | Status: AC

## 2018-08-13 NOTE — Progress Notes (Signed)
BH MD/PA/NP OP Progress Note  08/13/2018 10:04 AM Chris [name]  MRN:  [id number]  Chief Complaint: doing well  HPI: Chris [name] reports his mood and anxiety have been stable.  He is respecting his boundaries and introverted nature.  The Zoloft seems to be quite effective for his needs.  He understands this will be our last visit and he would like to just get his primary care doctor refill his medicines on a yearly basis.  Given that this is a maintenance medication I gave him a 1 year prescription for him to take to the [place] and he can follow-up for his routine physical yearly for a refill.  We spent time processing some of the progress he is made over the past year and he reports gratitude for the care and he reports that he feels he has grown in his wisdom and his ability to recognize what he needs is a person in order to manage his anxiety..  Visit Diagnosis:    ICD-10-CM   1. Chronic post-traumatic stress disorder (PTSD) F43.12 sertraline (ZOLOFT) 100 MG tablet    Past Psychiatric History: See intake H&P for full details. Reviewed, with no updates at this time.   Past Medical History:  Past Medical History:  Diagnosis Date  . Bulging lumbar disc   . Carpal tunnel syndrome   . Diabetes mellitus without complication (HCC)   . PTSD (post-traumatic stress disorder)     Past Surgical History:  Procedure Laterality Date  . PENILE PROSTHESIS IMPLANT N/A 2016    Family Psychiatric History: See intake H&P for full details. Reviewed, with no updates at this time.   Family History:  Family History  Problem Relation Age of Onset  . Diabetes Mother     Social History:  Social History   Socioeconomic History  . Marital status: Married    Spouse name: Not on file  . Number of children: Not on file  . Years of education: Not on file  . Highest education level: Not on file  Occupational History  . Not on file  Social Needs  . Financial resource strain: Not on file  . Food  insecurity:    Worry: Not on file    Inability: Not on file  . Transportation needs:    Medical: Not on file    Non-medical: Not on file  Tobacco Use  . Smoking status: Former Games developer  . Smokeless tobacco: Never Used  Substance and Sexual Activity  . Alcohol use: Yes    Comment: occasional glass of wine  . Drug use: No  . Sexual activity: Yes    Partners: Female    Birth control/protection: None  Lifestyle  . Physical activity:    Days per week: Not on file    Minutes per session: Not on file  . Stress: Not on file  Relationships  . Social connections:    Talks on phone: Not on file    Gets together: Not on file    Attends religious service: Not on file    Active member of club or organization: Not on file    Attends meetings of clubs or organizations: Not on file    Relationship status: Not on file  Other Topics Concern  . Not on file  Social History Narrative  . Not on file    Allergies: No Known Allergies  Metabolic Disorder Labs: Lab Results  Component Value Date   HGBA1C 6.7 01/03/2009   No results found for: PROLACTIN  Lab Results  Component Value Date   CHOL 178 01/03/2009   TRIG 76 01/03/2009   HDL 57 01/03/2009   CHOLHDL 3.1 Ratio 01/03/2009   VLDL 15 01/03/2009   LDLCALC 106 (H) 01/03/2009   LDLCALC 95 09/28/2007   Lab Results  Component Value Date   TSH 1.536 01/03/2009    Therapeutic Level Labs: No results found for: LITHIUM No results found for: VALPROATE No components found for:  CBMZ  Current Medications: Current Outpatient Medications  Medication Sig Dispense Refill  . atorvastatin (LIPITOR) 40 MG tablet Take 20 mg by mouth daily.    Marland Kitchen. LANTUS 100 UNIT/ML injection Inject 50 Units into the skin at bedtime.     Marland Kitchen. latanoprost (XALATAN) 0.005 % ophthalmic solution     . NOVOLOG FLEXPEN 100 UNIT/ML SOPN FlexPen Inject 5-7 Units into the skin 3 (three) times daily with meals. Per sliding scale    . sertraline (ZOLOFT) 100 MG tablet Take 1  tablet (100 mg total) by mouth daily. 90 tablet 3   No current facility-administered medications for this visit.      Musculoskeletal: Strength & Muscle Tone: within normal limits Gait & Station: normal Patient leans: N/A  Psychiatric Specialty Exam: ROS  There were no vitals taken for this visit.There is no height or weight on file to calculate BMI.  General Appearance: Casual and Well Groomed  Eye Contact:  Good  Speech:  Clear and Coherent and Normal Rate  Volume:  Normal  Mood:  Euthymic  Affect:  Congruent  Thought Process:  Coherent, Goal Directed and Descriptions of Associations: Intact  Orientation:  Full (Time, Place, and Person)  Thought Content: Logical   Suicidal Thoughts:  No  Homicidal Thoughts:  No  Memory:  Immediate;   Good  Judgement:  Good  Insight:  Fair  Psychomotor Activity:  Normal  Concentration:  Concentration: Good  Recall:  Good  Fund of Knowledge: Good  Language: Good  Akathisia:  Negative  Handed:  Left  AIMS (if indicated): not done  Assets:  Communication Skills Desire for Improvement Financial Resources/Insurance Housing Transportation Vocational/Educational  ADL's:  Intact  Cognition: WNL  Sleep:  Good   Screenings:   Assessment and Plan:  Darci CurrentVincent J Hedgepath presents for medication management for chronic PTSD.  He has done quite well on Zoloft 100 mg daily and we agreed to continue that medication as a maintenance medication.  He is appropriate to follow-up with [facility] for routine yearly physicals and refill of Zoloft.  He does not present with any acute safety concerns and he is employed consistently and has good support from his family.  1. Chronic post-traumatic stress disorder (PTSD)    Status of current problems: stable  Labs Ordered: No orders of the defined types were placed in this encounter.  Labs Reviewed: na  Collateral Obtained/Records Reviewed: na  Plan:  Zoloft 100 mg  daily Follow-up with PCP  Burnard LeighAlexander Arya Eksir, MD 08/13/2018, 10:04 AM

## 2020-04-20 ENCOUNTER — Other Ambulatory Visit: Payer: Self-pay

## 2020-04-20 ENCOUNTER — Ambulatory Visit: Payer: Self-pay

## 2020-04-20 ENCOUNTER — Ambulatory Visit: Payer: BC Managed Care – PPO | Admitting: Orthopaedic Surgery

## 2020-04-20 ENCOUNTER — Encounter: Payer: Self-pay | Admitting: Orthopaedic Surgery

## 2020-04-20 VITALS — Ht 65.0 in | Wt 198.0 lb

## 2020-04-20 DIAGNOSIS — M25561 Pain in right knee: Secondary | ICD-10-CM | POA: Diagnosis not present

## 2020-04-20 DIAGNOSIS — G8929 Other chronic pain: Secondary | ICD-10-CM | POA: Diagnosis not present

## 2020-04-20 MED ORDER — MELOXICAM 7.5 MG PO TABS: 7.5000 mg | ORAL_TABLET | Freq: Two times a day (BID) | ORAL | 2 refills | Status: DC | PRN

## 2020-04-20 NOTE — Progress Notes (Signed)
Office Visit Note   Patient: Chris [name]           Date of Birth: 1966-06-18           MRN: [id number] Visit Date: 04/20/2020              Requested by: No referring provider defined for this encounter. PCP: Patient, No Pcp Per   Assessment & Plan: Visit Diagnoses:  1. Chronic pain of right knee     Plan: Impression is right knee pain either mild OA versus degenerative medial meniscal tear.  We discussed treatment options of NSAIDs and knee brace versus cortisone injection.  Patient would like to hold off on cortisone injection for now and see how he does with the meloxicam and a knee brace.  Follow-up as needed.  Follow-Up Instructions: Return if symptoms worsen or fail to improve.   Orders:  Orders Placed This Encounter  Procedures  . XR KNEE 3 VIEW RIGHT   Meds ordered this encounter  Medications  . meloxicam (MOBIC) 7.5 MG tablet    Sig: Take 1 tablet (7.5 mg total) by mouth 2 (two) times daily as needed for pain.    Dispense:  30 tablet    Refill:  2      Procedures: No procedures performed   Clinical Data: No additional findings.   Subjective: Chief Complaint  Patient presents with  . Right Knee - Pain    Chris [name] is a pleasant 54 year old gentleman who comes in for evaluation of chronic right knee pain for a month on the medial side.  The pain is worse when getting out of his car and while standing at work on concrete floors.  He he has occasional swelling and he feels grinding and giving way sensation.  Denies any injuries or previous surgeries to the right knee.  He has been wearing over-the-counter knee brace.   Review of Systems  Constitutional: Negative.   All other systems reviewed and are negative.    Objective: Vital Signs: Ht 5\' 5"  (1.651 m)   Wt 198 lb (89.8 kg)   BMI 32.95 kg/m   Physical Exam Vitals and nursing note reviewed.  Constitutional:      Appearance: He is well-developed.  HENT:     Head: Normocephalic and atraumatic.   Eyes:     Pupils: Pupils are equal, round, and reactive to light.  Pulmonary:     Effort: Pulmonary effort is normal.  Abdominal:     Palpations: Abdomen is soft.  Musculoskeletal:        General: Normal range of motion.     Cervical back: Neck supple.  Skin:    General: Skin is warm.  Neurological:     Mental Status: He is alert and oriented to person, place, and time.  Psychiatric:        Behavior: Behavior normal.        Thought Content: Thought content normal.        Judgment: Judgment normal.     Ortho Exam Right knee shows trace joint effusion.  Medial joint line tenderness.  Full range of motion.  Collaterals and cruciates are stable. Specialty Comments:  No specialty comments available.  Imaging: XR KNEE 3 VIEW RIGHT  Result Date: 04/20/2020 Small medial tibial plateau spur.  No degenerative changes    PMFS History: Patient Active Problem List   Diagnosis Date Noted  . CLOSED FRACTURE OF TRIQUETRAL BONE OF WRIST 01/26/2009  . HYPERCHOLESTEROLEMIA 01/03/2009  . PAIN  IN SOFT TISSUES OF LIMB 01/03/2009  . DERMATOPHYTOSIS OF FOOT 05/10/2008  . FURUNCLE 04/07/2008  . ERECTILE DYSFUNCTION 11/02/2007  . DIABETES MELLITUS, TYPE II, ON INSULIN 09/28/2007   Past Medical History:  Diagnosis Date  . Bulging lumbar disc   . Carpal tunnel syndrome   . Diabetes mellitus without complication (HCC)   . PTSD (post-traumatic stress disorder)     Family History  Problem Relation Age of Onset  . Diabetes Mother     Past Surgical History:  Procedure Laterality Date  . PENILE PROSTHESIS IMPLANT N/A 2016   Social History   Occupational History  . Not on file  Tobacco Use  . Smoking status: Former Games developer  . Smokeless tobacco: Never Used  Substance and Sexual Activity  . Alcohol use: Yes    Comment: occasional glass of wine  . Drug use: No  . Sexual activity: Yes    Partners: Female    Birth control/protection: None

## 2020-05-29 ENCOUNTER — Ambulatory Visit: Payer: BC Managed Care – PPO | Admitting: Orthopaedic Surgery

## 2020-05-29 DIAGNOSIS — G8929 Other chronic pain: Secondary | ICD-10-CM | POA: Diagnosis not present

## 2020-05-29 DIAGNOSIS — M25561 Pain in right knee: Secondary | ICD-10-CM

## 2020-05-29 MED ORDER — BUPIVACAINE HCL 0.5 % IJ SOLN
2.0000 mL | INTRAMUSCULAR | Status: AC | PRN
  Administered 2020-05-29: 2 mL via INTRA_ARTICULAR

## 2020-05-29 MED ORDER — LIDOCAINE HCL 1 % IJ SOLN
2.0000 mL | INTRAMUSCULAR | Status: AC | PRN
  Administered 2020-05-29: 2 mL

## 2020-05-29 MED ORDER — METHYLPREDNISOLONE ACETATE 40 MG/ML IJ SUSP
40.0000 mg | INTRAMUSCULAR | Status: AC | PRN
  Administered 2020-05-29: 40 mg via INTRA_ARTICULAR

## 2020-05-29 NOTE — Progress Notes (Signed)
Office Visit Note   Patient: Chris [name]           Date of Birth: October 26, 1966           MRN: [id number] Visit Date: 05/29/2020              Requested by: No referring provider defined for this encounter. PCP: Patient, No Pcp Per   Assessment & Plan: Visit Diagnoses:  1. Chronic pain of right knee     Plan: He continues to have pain with weightbearing with occasional mechanical symptoms.  Based on our discussion he would like to move forward with MRI to rule out structural abnormalities as well as receive a cortisone injection today.  We will see him back after the MRI.  Follow-Up Instructions: Return if symptoms worsen or fail to improve.   Orders:  Orders Placed This Encounter  Procedures  . MR Knee Right w/o contrast   No orders of the defined types were placed in this encounter.     Procedures: Large Joint Inj: R knee on 05/29/2020 4:33 PM Indications: pain Details: 22 G needle  Arthrogram: No  Medications: 40 mg methylPREDNISolone acetate 40 MG/ML; 2 mL lidocaine 1 %; 2 mL bupivacaine 0.5 % Consent was given by the patient. Patient was prepped and draped in the usual sterile fashion.       Clinical Data: No additional findings.   Subjective: Chief Complaint  Patient presents with  . Right Knee - Pain    Chris [name] returns today for chronic right knee pain.  He states the meloxicam was not helpful.  He still has some occasional clicking and catching with some mild pain.   Review of Systems  Constitutional: Negative.   All other systems reviewed and are negative.    Objective: Vital Signs: There were no vitals taken for this visit.  Physical Exam Vitals and nursing note reviewed.  Constitutional:      Appearance: He is well-developed.  Pulmonary:     Effort: Pulmonary effort is normal.  Abdominal:     Palpations: Abdomen is soft.  Skin:    General: Skin is warm.  Neurological:     Mental Status: He is alert and oriented to person, place,  and time.  Psychiatric:        Behavior: Behavior normal.        Thought Content: Thought content normal.        Judgment: Judgment normal.     Ortho Exam Right knee exam is unchanged.  Medial joint line tenderness. Specialty Comments:  No specialty comments available.  Imaging: No results found.   PMFS History: Patient Active Problem List   Diagnosis Date Noted  . CLOSED FRACTURE OF TRIQUETRAL BONE OF WRIST 01/26/2009  . HYPERCHOLESTEROLEMIA 01/03/2009  . PAIN IN SOFT TISSUES OF LIMB 01/03/2009  . DERMATOPHYTOSIS OF FOOT 05/10/2008  . FURUNCLE 04/07/2008  . ERECTILE DYSFUNCTION 11/02/2007  . DIABETES MELLITUS, TYPE II, ON INSULIN 09/28/2007   Past Medical History:  Diagnosis Date  . Bulging lumbar disc   . Carpal tunnel syndrome   . Diabetes mellitus without complication (HCC)   . PTSD (post-traumatic stress disorder)     Family History  Problem Relation Age of Onset  . Diabetes Mother     Past Surgical History:  Procedure Laterality Date  . PENILE PROSTHESIS IMPLANT N/A 2016   Social History   Occupational History  . Not on file  Tobacco Use  . Smoking status: Former Games developer  .  Smokeless tobacco: Never Used  Vaping Use  . Vaping Use: Never used  Substance and Sexual Activity  . Alcohol use: Yes    Comment: occasional glass of wine  . Drug use: No  . Sexual activity: Yes    Partners: Female    Birth control/protection: None

## 2020-07-05 ENCOUNTER — Other Ambulatory Visit: Payer: BC Managed Care – PPO

## 2020-08-09 ENCOUNTER — Ambulatory Visit (INDEPENDENT_AMBULATORY_CARE_PROVIDER_SITE_OTHER): Payer: BC Managed Care – PPO | Admitting: Orthopaedic Surgery

## 2020-08-09 ENCOUNTER — Encounter: Payer: Self-pay | Admitting: Orthopaedic Surgery

## 2020-08-09 VITALS — Ht 65.0 in | Wt 198.0 lb

## 2020-08-09 DIAGNOSIS — M1711 Unilateral primary osteoarthritis, right knee: Secondary | ICD-10-CM | POA: Diagnosis not present

## 2020-08-09 NOTE — Progress Notes (Signed)
   Office Visit Note   Patient: Chris [name]           Date of Birth: 1966/01/27           MRN: [id number] Visit Date: 08/09/2020              Requested by: No referring provider defined for this encounter. PCP: Patient, No Pcp Per   Assessment & Plan: Visit Diagnoses:  1. Primary osteoarthritis of right knee     Plan: MRI from [facility] shows basically grade 3-4 changes of the medial compartment as well as the femoral trochlea.  The menisci are unremarkable.  I am glad that he is doing much better from the cortisone injection.  I did give him a pamphlet on Visco injection.  He will make a significant effort to lose weight as well.  Questions encouraged and answered.  Follow-up as needed.  Follow-Up Instructions: Return if symptoms worsen or fail to improve.   Orders:  No orders of the defined types were placed in this encounter.  No orders of the defined types were placed in this encounter.     Procedures: No procedures performed   Clinical Data: No additional findings.   Subjective: Chief Complaint  Patient presents with  . Right Knee - Follow-up    MRI review    Chris [name] returns today for MRI review of the right knee.  He has felt a lot better since receiving the cortisone injection but it has made his sugars increase.   Review of Systems   Objective: Vital Signs: Ht 5\' 5"  (1.651 m)   Wt 198 lb (89.8 kg)   BMI 32.95 kg/m   Physical Exam  Ortho Exam Right knee exam is unchanged. Specialty Comments:  No specialty comments available.  Imaging: No results found.   PMFS History: Patient Active Problem List   Diagnosis Date Noted  . CLOSED FRACTURE OF TRIQUETRAL BONE OF WRIST 01/26/2009  . HYPERCHOLESTEROLEMIA 01/03/2009  . PAIN IN SOFT TISSUES OF LIMB 01/03/2009  . DERMATOPHYTOSIS OF FOOT 05/10/2008  . FURUNCLE 04/07/2008  . ERECTILE DYSFUNCTION 11/02/2007  . DIABETES MELLITUS, TYPE II, ON INSULIN 09/28/2007   Past Medical History:    Diagnosis Date  . Bulging lumbar disc   . Carpal tunnel syndrome   . Diabetes mellitus without complication (HCC)   . PTSD (post-traumatic stress disorder)     Family History  Problem Relation Age of Onset  . Diabetes Mother     Past Surgical History:  Procedure Laterality Date  . PENILE PROSTHESIS IMPLANT N/A 2016   Social History   Occupational History  . Not on file  Tobacco Use  . Smoking status: Former 2017  . Smokeless tobacco: Never Used  Vaping Use  . Vaping Use: Never used  Substance and Sexual Activity  . Alcohol use: Yes    Comment: occasional glass of wine  . Drug use: No  . Sexual activity: Yes    Partners: Female    Birth control/protection: None

## 2021-03-17 ENCOUNTER — Emergency Department (HOSPITAL_COMMUNITY)
Admission: EM | Admit: 2021-03-17 | Discharge: 2021-03-17 | Disposition: A | Discharge status: Home / Self Care | Payer: No Typology Code available for payment source | Attending: Emergency Medicine | Admitting: Emergency Medicine

## 2021-03-17 ENCOUNTER — Ambulatory Visit (HOSPITAL_COMMUNITY)
Admission: EM | Admit: 2021-03-17 | Discharge: 2021-03-17 | Disposition: A | Discharge status: Home / Self Care | Payer: No Typology Code available for payment source | Attending: Internal Medicine | Admitting: Internal Medicine

## 2021-03-17 ENCOUNTER — Encounter (HOSPITAL_COMMUNITY): Payer: Self-pay | Admitting: Emergency Medicine

## 2021-03-17 ENCOUNTER — Other Ambulatory Visit: Payer: Self-pay

## 2021-03-17 ENCOUNTER — Encounter (HOSPITAL_COMMUNITY): Payer: Self-pay | Admitting: *Deleted

## 2021-03-17 ENCOUNTER — Emergency Department (HOSPITAL_COMMUNITY): Payer: No Typology Code available for payment source

## 2021-03-17 DIAGNOSIS — R1011 Right upper quadrant pain: Secondary | ICD-10-CM | POA: Diagnosis not present

## 2021-03-17 DIAGNOSIS — E119 Type 2 diabetes mellitus without complications: Secondary | ICD-10-CM | POA: Diagnosis not present

## 2021-03-17 DIAGNOSIS — Z794 Long term (current) use of insulin: Secondary | ICD-10-CM | POA: Insufficient documentation

## 2021-03-17 DIAGNOSIS — K8500 Idiopathic acute pancreatitis without necrosis or infection: Secondary | ICD-10-CM | POA: Insufficient documentation

## 2021-03-17 DIAGNOSIS — Z79899 Other long term (current) drug therapy: Secondary | ICD-10-CM | POA: Insufficient documentation

## 2021-03-17 DIAGNOSIS — R1013 Epigastric pain: Secondary | ICD-10-CM

## 2021-03-17 LAB — COMPREHENSIVE METABOLIC PANEL
ALT: 17 U/L (ref 0–44)
AST: 14 U/L — ABNORMAL LOW (ref 15–41)
Albumin: 3.3 g/dL — ABNORMAL LOW (ref 3.5–5.0)
Alkaline Phosphatase: 80 U/L (ref 38–126)
Anion gap: 6 (ref 5–15)
BUN: 9 mg/dL (ref 6–20)
CO2: 28 mmol/L (ref 22–32)
Calcium: 8.7 mg/dL — ABNORMAL LOW (ref 8.9–10.3)
Chloride: 102 mmol/L (ref 98–111)
Creatinine, Ser: 0.93 mg/dL (ref 0.61–1.24)
GFR, Estimated: >60 mL/min (ref >60)
Glucose, Bld: 273 mg/dL — ABNORMAL HIGH (ref 70–99)
Potassium: 4.4 mmol/L (ref 3.5–5.1)
Sodium: 136 mmol/L (ref 135–145)
Total Bilirubin: 0.9 mg/dL (ref 0.3–1.2)
Total Protein: 7.2 g/dL (ref 6.5–8.1)

## 2021-03-17 LAB — POCT URINALYSIS DIPSTICK, ED / UC
Bilirubin Urine: NEGATIVE
Glucose, UA: >=1000 mg/dL — AB
Hgb urine dipstick: NEGATIVE
Ketones, ur: 15 mg/dL — AB
Leukocytes,Ua: NEGATIVE
Nitrite: NEGATIVE
Protein, ur: 30 mg/dL — AB
Specific Gravity, Urine: 1.025 (ref 1.005–1.030)
Urobilinogen, UA: 0.2 mg/dL (ref 0.0–1.0)
pH: 5.5 (ref 5.0–8.0)

## 2021-03-17 LAB — CBC WITH DIFFERENTIAL/PLATELET
Abs Immature Granulocytes: 0.01 10*3/uL (ref 0.00–0.07)
Basophils Absolute: 0 10*3/uL (ref 0.0–0.1)
Basophils Relative: 1 %
Eosinophils Absolute: 0 10*3/uL (ref 0.0–0.5)
Eosinophils Relative: 1 %
HCT: 46.5 % (ref 39.0–52.0)
Hemoglobin: 15 g/dL (ref 13.0–17.0)
Immature Granulocytes: 0 %
Lymphocytes Relative: 29 %
Lymphs Abs: 1.7 10*3/uL (ref 0.7–4.0)
MCH: 31.8 pg (ref 26.0–34.0)
MCHC: 32.3 g/dL (ref 30.0–36.0)
MCV: 98.7 fL (ref 80.0–100.0)
Monocytes Absolute: 0.5 10*3/uL (ref 0.1–1.0)
Monocytes Relative: 9 %
Neutro Abs: 3.4 10*3/uL (ref 1.7–7.7)
Neutrophils Relative %: 60 %
Platelets: 361 10*3/uL (ref 150–400)
RBC: 4.71 MIL/uL (ref 4.22–5.81)
RDW: 12.5 % (ref 11.5–15.5)
WBC: 5.7 10*3/uL (ref 4.0–10.5)
nRBC: 0 % (ref 0.0–0.2)

## 2021-03-17 LAB — CBG MONITORING, ED: Glucose-Capillary: 258 mg/dL — ABNORMAL HIGH (ref 70–99)

## 2021-03-17 LAB — LIPASE, BLOOD: Lipase: 126 U/L — ABNORMAL HIGH (ref 11–51)

## 2021-03-17 NOTE — Discharge Instructions (Addendum)
As discussed, today's evaluation has been generally reassuring. Your labs indicate that you are experiencing pancreatitis.  This typically improves with a clear liquid diet utilized for at least the next 2 days, with subsequent gradual reintroduction of typical food.  In addition, to support a schedule follow-up with your gastroenterology team at the [facility].  Return here for concerning changes in your condition.

## 2021-03-17 NOTE — ED Triage Notes (Signed)
Pt reports to Rt side of ribs . Pt reports he has continued  To go to work . 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

## 2021-03-17 NOTE — ED Provider Notes (Signed)
[facility]    CSN: [id number] Arrival date & time: 03/17/21  1134      History   Chief Complaint Chief Complaint  Patient presents with  . side pain    Rt side    HPI Chris [name] is a 55 y.o. male.   Patient here for evaluation of right upper quadrant and epigastric pain that has been ongoing for the past week.  Reports pain is constant but worsening.  Reports trying Mylanta and OTC gas relief with no relief of symptoms.  Denies any nausea or vomiting.  Reports last bowel movement was yesterday and denies diarrhea.  Denies fevers at home but temperature was 100 in office.  Reports nothing has made pain better and eating causes pain to be worse. Denies any chest pain, shortness of breath,  numbness, tingling, weakness, or headaches.   ROS: As per HPI, all other pertinent ROS negative    The history is provided by the patient.    Past Medical History:  Diagnosis Date  . Bulging lumbar disc   . Carpal tunnel syndrome   . Diabetes mellitus without complication (HCC)   . PTSD (post-traumatic stress disorder)     Patient Active Problem List   Diagnosis Date Noted  . CLOSED FRACTURE OF TRIQUETRAL BONE OF WRIST 01/26/2009  . HYPERCHOLESTEROLEMIA 01/03/2009  . PAIN IN SOFT TISSUES OF LIMB 01/03/2009  . DERMATOPHYTOSIS OF FOOT 05/10/2008  . FURUNCLE 04/07/2008  . ERECTILE DYSFUNCTION 11/02/2007  . DIABETES MELLITUS, TYPE II, ON INSULIN 09/28/2007    Past Surgical History:  Procedure Laterality Date  . PENILE PROSTHESIS IMPLANT N/A 2016       Home Medications    Prior to Admission medications   Medication Sig Start Date End Date Taking? Authorizing Provider  atorvastatin (LIPITOR) 40 MG tablet Take 20 mg by mouth daily. 09/09/13   [provider]  LANTUS 100 UNIT/ML injection Inject 50 Units into the skin at bedtime.  09/09/13   [provider]  latanoprost (XALATAN) 0.005 % ophthalmic solution  04/22/17   [provider]   meloxicam (MOBIC) 7.5 MG tablet Take 1 tablet (7.5 mg total) by mouth 2 (two) times daily as needed for pain. 04/20/20   Tarry Kos, MD  NOVOLOG FLEXPEN 100 UNIT/ML SOPN FlexPen Inject 5-7 Units into the skin 3 (three) times daily with meals. Per sliding scale 09/09/13   [provider]  sertraline (ZOLOFT) 100 MG tablet Take 1 tablet (100 mg total) by mouth daily. 08/13/18 08/13/19  Burnard Leigh, MD    Family History Family History  Problem Relation Age of Onset  . Diabetes Mother     Social History Social History   Tobacco Use  . Smoking status: Former Games developer  . Smokeless tobacco: Never Used  Vaping Use  . Vaping Use: Never used  Substance Use Topics  . Alcohol use: Yes    Comment: occasional glass of wine  . Drug use: No     Allergies   Patient has no known allergies.   Review of Systems Review of Systems  Gastrointestinal: Positive for abdominal pain. Negative for diarrhea, nausea and vomiting.     Physical Exam Triage Vital Signs ED Triage Vitals  Enc Vitals Group     BP 03/17/21 1233 (!) 145/98     Pulse Rate 03/17/21 1233 100     Resp 03/17/21 1233 18     Temp 03/17/21 1233 100 F (37.8 C)     Temp  Source 03/17/21 1233 Oral     SpO2 03/17/21 1233 95 %     Weight --      Height --      Head Circumference --      Peak Flow --      Pain Score 03/17/21 1229 10     Pain Loc --      Pain Edu? --      Excl. in GC? --    No data found.  Updated Vital Signs BP (!) 145/98 (BP Location: Right Arm)   Pulse 100   Temp 100 F (37.8 C) (Oral)   Resp 18   SpO2 95%   Visual Acuity Right Eye Distance:   Left Eye Distance:   Bilateral Distance:    Right Eye Near:   Left Eye Near:    Bilateral Near:     Physical Exam Vitals and nursing note reviewed.  Constitutional:      General: He is not in acute distress.    Appearance: Normal appearance. He is not ill-appearing, toxic-appearing or diaphoretic.  HENT:     Head: Normocephalic  and atraumatic.  Eyes:     Conjunctiva/sclera: Conjunctivae normal.  Cardiovascular:     Rate and Rhythm: Normal rate.     Pulses: Normal pulses.  Pulmonary:     Effort: Pulmonary effort is normal.  Abdominal:     General: Abdomen is flat. Bowel sounds are normal.     Palpations: Abdomen is soft.     Tenderness: There is abdominal tenderness in the right upper quadrant. There is guarding. There is no right CVA tenderness, left CVA tenderness or rebound. Positive signs include Murphy's sign. Negative signs include Rovsing's sign, McBurney's sign, psoas sign and obturator sign.     Hernia: No hernia is present.  Musculoskeletal:        General: Normal range of motion.     Cervical back: Normal range of motion.  Skin:    General: Skin is warm and dry.  Neurological:     General: No focal deficit present.     Mental Status: He is alert and oriented to person, place, and time.  Psychiatric:        Mood and Affect: Mood normal.      UC Treatments / Results  Labs (all labs ordered are listed, but only abnormal results are displayed) Labs Reviewed  CBG MONITORING, ED - Abnormal; Notable for the following components:      Result Value   Glucose-Capillary 258 (*)    All other components within normal limits  POCT URINALYSIS DIPSTICK, ED / UC - Abnormal; Notable for the following components:   Glucose, UA >=1000 (*)    Ketones, ur 15 (*)    Protein, ur 30 (*)    All other components within normal limits    EKG   Radiology No results found.  Procedures Procedures (including critical care time)  Medications Ordered in UC Medications - No data to display  Initial Impression / Assessment and Plan / UC Course  I have reviewed the triage vital signs and the nursing notes.  Pertinent labs & imaging results that were available during my care of the patient were reviewed by me and considered in my medical decision making (see chart for details).     Right upper quadrant  pain Assessment concerning for cholecystitis.  Urinalysis shows glucose and ketones, CBG in office 258.  Based on findings and fever patient instructed to go to the emergency room  for further evaluation of right upper quadrant abdominal pain.   Final Clinical Impressions(s) / UC Diagnoses   Final diagnoses:  RUQ pain     Discharge Instructions     Go to the emergency department for further evaluation of your abdominal pain.     ED Prescriptions    None     PDMP not reviewed this encounter.   Ivette Loyal, NP 03/17/21 1341

## 2021-03-17 NOTE — ED Provider Notes (Signed)
[facility] EMERGENCY DEPARTMENT Provider Note   CSN: [id number] Arrival date & time: 03/17/21  1344     History Chief Complaint  Patient presents with  . Abdominal Pain    Chris [name] is a 55 y.o. male.  HPI Patient presents with abdominal pain, nausea.  There is some mild anorexia, but no vomiting, no change in bowel movements.  Onset was about 1 week ago.  He has had negligible change with Mylanta.  He has no history of abdominal surgery.  Patient used to drink socially, does not drink regularly, does not smoke.  Abdominal pain is epigastric and right upper quadrant, intermittent, mild severity, inconsistently in the right upper or the epigastric region.    Past Medical History:  Diagnosis Date  . Bulging lumbar disc   . Carpal tunnel syndrome   . Diabetes mellitus without complication (HCC)   . PTSD (post-traumatic stress disorder)     Patient Active Problem List   Diagnosis Date Noted  . CLOSED FRACTURE OF TRIQUETRAL BONE OF WRIST 01/26/2009  . HYPERCHOLESTEROLEMIA 01/03/2009  . PAIN IN SOFT TISSUES OF LIMB 01/03/2009  . DERMATOPHYTOSIS OF FOOT 05/10/2008  . FURUNCLE 04/07/2008  . ERECTILE DYSFUNCTION 11/02/2007  . DIABETES MELLITUS, TYPE II, ON INSULIN 09/28/2007    Past Surgical History:  Procedure Laterality Date  . PENILE PROSTHESIS IMPLANT N/A 2016       Family History  Problem Relation Age of Onset  . Diabetes Mother     Social History   Tobacco Use  . Smoking status: Former Games developer  . Smokeless tobacco: Never Used  Vaping Use  . Vaping Use: Never used  Substance Use Topics  . Alcohol use: Yes    Comment: occasional glass of wine  . Drug use: No    Home Medications Prior to Admission medications   Medication Sig Start Date End Date Taking? Authorizing Provider  atorvastatin (LIPITOR) 40 MG tablet Take 20 mg by mouth daily. 09/09/13   [provider]  LANTUS 100 UNIT/ML injection Inject 50 Units into the skin at  bedtime.  09/09/13   [provider]  latanoprost (XALATAN) 0.005 % ophthalmic solution  04/22/17   [provider]  meloxicam (MOBIC) 7.5 MG tablet Take 1 tablet (7.5 mg total) by mouth 2 (two) times daily as needed for pain. 04/20/20   Tarry Kos, MD  NOVOLOG FLEXPEN 100 UNIT/ML SOPN FlexPen Inject 5-7 Units into the skin 3 (three) times daily with meals. Per sliding scale 09/09/13   [provider]  sertraline (ZOLOFT) 100 MG tablet Take 1 tablet (100 mg total) by mouth daily. 08/13/18 08/13/19  Burnard Leigh, MD    Allergies    Patient has no known allergies.  Review of Systems   Review of Systems  Constitutional:       Per HPI, otherwise negative  HENT:       Per HPI, otherwise negative  Respiratory:       Per HPI, otherwise negative  Cardiovascular:       Per HPI, otherwise negative  Gastrointestinal: Positive for abdominal pain and nausea. Negative for vomiting.  Endocrine:       Negative aside from HPI  Genitourinary:       Neg aside from HPI   Musculoskeletal:       Per HPI, otherwise negative  Skin: Negative.   Neurological: Negative for syncope.    Physical Exam Updated Vital Signs BP (!) 154/93   Pulse 75  Temp 98.2 F (36.8 C) (Oral)   Resp 18   SpO2 98%   Physical Exam Vitals and nursing note reviewed.  Constitutional:      General: He is not in acute distress.    Appearance: He is well-developed.  HENT:     Head: Normocephalic and atraumatic.  Eyes:     Conjunctiva/sclera: Conjunctivae normal.  Cardiovascular:     Rate and Rhythm: Normal rate and regular rhythm.  Pulmonary:     Effort: Pulmonary effort is normal. No respiratory distress.     Breath sounds: No stridor.  Abdominal:     General: There is no distension.     Tenderness: There is abdominal tenderness in the epigastric area. There is no guarding or rebound. Negative signs include Murphy's sign.  Skin:    General: Skin is warm and dry.  Neurological:      Mental Status: He is alert and oriented to person, place, and time.     ED Results / Procedures / Treatments   Labs (all labs ordered are listed, but only abnormal results are displayed) Labs Reviewed  COMPREHENSIVE METABOLIC PANEL - Abnormal; Notable for the following components:      Result Value   Glucose, Bld 273 (*)    Calcium 8.7 (*)    Albumin 3.3 (*)    AST 14 (*)    All other components within normal limits  LIPASE, BLOOD - Abnormal; Notable for the following components:   Lipase 126 (*)    All other components within normal limits  CBC WITH DIFFERENTIAL/PLATELET  URINALYSIS, ROUTINE W REFLEX MICROSCOPIC    EKG None  Radiology US Abdomen Limited RUQ (LIVER/GB)  Result Date: 03/17/2021 CLINICAL DATA:  Right upper quadrant pain. EXAM: ULTRASOUND ABDOMEN LIMITED RIGHT UPPER QUADRANT COMPARISON:  None. FINDINGS: Gallbladder: No gallstones or wall thickening visualized. No sonographic Murphy sign noted by sonographer. Common bile duct: Diameter: 4.2 mm. Liver: No focal lesion identified. Within normal limits in parenchymal echogenicity. Portal vein is patent on color Doppler imaging with normal direction of blood flow towards the liver. Other: None. IMPRESSION: No acute findings. Electronically Signed   By: Elberta Fortis M.D.   On: 03/17/2021 15:28    Procedures Procedures   Medications Ordered in ED Medications - No data to display  ED Course  I have reviewed the triage vital signs and the nursing notes.  Pertinent labs & imaging results that were available during my care of the patient were reviewed by me and considered in my medical decision making (see chart for details).  On repeat exam the patient is awake, alert, in no distress.  We discussed possibilities for his pancreatitis. Patient is not a drinker, suspicion for idiopathic etiology. Patient comfortable with discharge with close outpatient follow-up, after lengthy conversation on clear liquid diet, return  precautions.  Without other abdominal pain, hemodynamic instability, ultrasound abnormalities or findings suggesting acute cholecystitis, patient discharged in stable condition with outpatient follow-up. Final Clinical Impression(s) / ED Diagnoses Final diagnoses:  RUQ abdominal pain  Epigastric pain  Idiopathic acute pancreatitis without infection or necrosis     Gerhard Munch, MD 03/17/21 2241

## 2021-03-17 NOTE — ED Provider Notes (Signed)
Patient placed in Quick Look pathway, seen and evaluated   Chief Complaint: Right upper quadrant abdominal pain  HPI:   54 year old male presents from urgent care with 1 week of intermittent right upper quadrant abdominal pain, sometimes worse with meals.  No associated vomiting or diarrhea, sent from urgent care for concern for cholecystitis  ROS: + Abdominal pain   -Fevers, vomiting, diarrhea  Physical Exam:   Gen: No distress  Neuro: Awake and Alert  Skin: Warm    Focused Exam: Tenderness in the right upper quadrant and epigastric region, no peritoneal signs   Initiation of care has begun. The patient has been counseled on the process, plan, and necessity for staying for the completion/evaluation, and the remainder of the medical screening examination    MSE was initiated and I personally evaluated the patient and placed orders (if any) at  2:07 PM on March 17, 2021.  The patient appears stable so that the remainder of the MSE may be completed by another provider.   Dartha Lodge, PA-C 03/17/21 1412    Koleen Distance, MD 03/17/21 [phone]

## 2021-03-17 NOTE — Discharge Instructions (Addendum)
Go to the emergency department for further evaluation of your abdominal pain

## 2021-03-17 NOTE — ED Triage Notes (Signed)
C/o RUQ pain with intermittent radiation into chest.  Pain sometimes worse after eating.  Denies nausea, vomiting, and diarrhea.  Sent from [facility].

## 2021-04-01 ENCOUNTER — Emergency Department (HOSPITAL_BASED_OUTPATIENT_CLINIC_OR_DEPARTMENT_OTHER): Payer: No Typology Code available for payment source

## 2021-04-01 ENCOUNTER — Encounter (HOSPITAL_BASED_OUTPATIENT_CLINIC_OR_DEPARTMENT_OTHER): Payer: Self-pay | Admitting: Emergency Medicine

## 2021-04-01 ENCOUNTER — Emergency Department (HOSPITAL_BASED_OUTPATIENT_CLINIC_OR_DEPARTMENT_OTHER)
Admission: EM | Admit: 2021-04-01 | Discharge: 2021-04-01 | Disposition: A | Discharge status: Home / Self Care | Payer: No Typology Code available for payment source | Attending: Emergency Medicine | Admitting: Emergency Medicine

## 2021-04-01 ENCOUNTER — Other Ambulatory Visit: Payer: Self-pay

## 2021-04-01 DIAGNOSIS — K5900 Constipation, unspecified: Secondary | ICD-10-CM | POA: Insufficient documentation

## 2021-04-01 DIAGNOSIS — Z87891 Personal history of nicotine dependence: Secondary | ICD-10-CM | POA: Diagnosis not present

## 2021-04-01 DIAGNOSIS — E119 Type 2 diabetes mellitus without complications: Secondary | ICD-10-CM | POA: Insufficient documentation

## 2021-04-01 DIAGNOSIS — R109 Unspecified abdominal pain: Secondary | ICD-10-CM | POA: Diagnosis present

## 2021-04-01 DIAGNOSIS — R1084 Generalized abdominal pain: Secondary | ICD-10-CM | POA: Insufficient documentation

## 2021-04-01 DIAGNOSIS — Z794 Long term (current) use of insulin: Secondary | ICD-10-CM | POA: Diagnosis not present

## 2021-04-01 LAB — CBC WITH DIFFERENTIAL/PLATELET
Abs Immature Granulocytes: 0.02 10*3/uL (ref 0.00–0.07)
Basophils Absolute: 0 10*3/uL (ref 0.0–0.1)
Basophils Relative: 1 %
Eosinophils Absolute: 0.1 10*3/uL (ref 0.0–0.5)
Eosinophils Relative: 1 %
HCT: 44.1 % (ref 39.0–52.0)
Hemoglobin: 14.4 g/dL (ref 13.0–17.0)
Immature Granulocytes: 1 %
Lymphocytes Relative: 35 %
Lymphs Abs: 1.4 10*3/uL (ref 0.7–4.0)
MCH: 31.9 pg (ref 26.0–34.0)
MCHC: 32.7 g/dL (ref 30.0–36.0)
MCV: 97.6 fL (ref 80.0–100.0)
Monocytes Absolute: 0.3 10*3/uL (ref 0.1–1.0)
Monocytes Relative: 8 %
Neutro Abs: 2.2 10*3/uL (ref 1.7–7.7)
Neutrophils Relative %: 54 %
Platelets: 336 10*3/uL (ref 150–400)
RBC: 4.52 MIL/uL (ref 4.22–5.81)
RDW: 12.8 % (ref 11.5–15.5)
WBC: 3.9 10*3/uL — ABNORMAL LOW (ref 4.0–10.5)
nRBC: 0 % (ref 0.0–0.2)

## 2021-04-01 LAB — URINALYSIS, ROUTINE W REFLEX MICROSCOPIC
Bilirubin Urine: NEGATIVE
Glucose, UA: 500 mg/dL — AB
Hgb urine dipstick: NEGATIVE
Ketones, ur: NEGATIVE mg/dL
Leukocytes,Ua: NEGATIVE
Nitrite: NEGATIVE
Protein, ur: NEGATIVE mg/dL
Specific Gravity, Urine: 1.011 (ref 1.005–1.030)
pH: 5.5 (ref 5.0–8.0)

## 2021-04-01 LAB — COMPREHENSIVE METABOLIC PANEL
ALT: 57 U/L — ABNORMAL HIGH (ref 0–44)
AST: 24 U/L (ref 15–41)
Albumin: 3.8 g/dL (ref 3.5–5.0)
Alkaline Phosphatase: 80 U/L (ref 38–126)
Anion gap: 7 (ref 5–15)
BUN: 12 mg/dL (ref 6–20)
CO2: 27 mmol/L (ref 22–32)
Calcium: 8.6 mg/dL — ABNORMAL LOW (ref 8.9–10.3)
Chloride: 103 mmol/L (ref 98–111)
Creatinine, Ser: 0.88 mg/dL (ref 0.61–1.24)
GFR, Estimated: >60 mL/min (ref >60)
Glucose, Bld: 229 mg/dL — ABNORMAL HIGH (ref 70–99)
Potassium: 4.1 mmol/L (ref 3.5–5.1)
Sodium: 137 mmol/L (ref 135–145)
Total Bilirubin: 0.8 mg/dL (ref 0.3–1.2)
Total Protein: 6.6 g/dL (ref 6.5–8.1)

## 2021-04-01 LAB — LIPASE, BLOOD: Lipase: 93 U/L — ABNORMAL HIGH (ref 11–51)

## 2021-04-01 MED ORDER — IOHEXOL 300 MG/ML  SOLN
100.0000 mL | Freq: Once | INTRAMUSCULAR | Status: AC | PRN
  Administered 2021-04-01: 100 mL via INTRAVENOUS

## 2021-04-01 MED ORDER — ALUM & MAG HYDROXIDE-SIMETH 200-200-20 MG/5ML PO SUSP
30.0000 mL | Freq: Once | ORAL | Status: AC
  Administered 2021-04-01: 30 mL via ORAL
  Filled 2021-04-01: qty 30

## 2021-04-01 MED ORDER — OMEPRAZOLE 20 MG PO CPDR: 20.0000 mg | DELAYED_RELEASE_CAPSULE | Freq: Every day | ORAL | 1 refills | Status: AC

## 2021-04-01 MED ORDER — ONDANSETRON 4 MG PO TBDP: 4.0000 mg | ORAL_TABLET | ORAL | 0 refills | Status: AC | PRN

## 2021-04-01 MED ORDER — FAMOTIDINE IN NACL 20-0.9 MG/50ML-% IV SOLN
20.0000 mg | Freq: Once | INTRAVENOUS | Status: AC
  Administered 2021-04-01: 20 mg via INTRAVENOUS
  Filled 2021-04-01: qty 50

## 2021-04-01 NOTE — Discharge Instructions (Signed)
1.  Continue to follow the instructions for pancreatitis.  Your lab values continue to be low for pancreatitis but still slightly above normal.  It is very important you follow-up with your doctor at the [facility] for continued monitoring of this lab value to make sure that it normalizes.  If not, you may need further evaluation. 2.  Rarely, cholesterol medication such as Lipitor can cause pancreatitis.  Discontinue this medication until you see your [facility] again to discuss resumption of it. 3.  Gastroesophageal reflux disease and gastritis are also common causes of upper abdominal discomfort.  This is treated with medications to decrease acid secretion and diet measures. start taking omeprazole as prescribed.  Take the omeprazole 30 minutes before you eat anything in the morning. 4.  Return to the emergency department if you are getting increasing pain, increasing pain with eating, vomiting or other concerning symptoms.

## 2021-04-01 NOTE — ED Provider Notes (Signed)
[facility] EMERGENCY DEPT Provider Note   CSN: [id number] Arrival date & time: 04/01/21  1416     History Chief Complaint  Patient presents with  . Abdominal Pain    Chris [name] is a 55 y.o. male.  Pt presents to the ED today with abdominal spasms.  The pt went to [facility] on 4/3 with abd pain and was diagnosed with pancreatitis.  Pt said he continues to have abd spasms.  He denies any n/v.  He has been slightly constipated (last bm yesterday).  Pt said he normally has a bm daily.  Pt denies any f/c.          Past Medical History:  Diagnosis Date  . Bulging lumbar disc   . Carpal tunnel syndrome   . Diabetes mellitus without complication (HCC)   . PTSD (post-traumatic stress disorder)     Patient Active Problem List   Diagnosis Date Noted  . CLOSED FRACTURE OF TRIQUETRAL BONE OF WRIST 01/26/2009  . HYPERCHOLESTEROLEMIA 01/03/2009  . PAIN IN SOFT TISSUES OF LIMB 01/03/2009  . DERMATOPHYTOSIS OF FOOT 05/10/2008  . FURUNCLE 04/07/2008  . ERECTILE DYSFUNCTION 11/02/2007  . DIABETES MELLITUS, TYPE II, ON INSULIN 09/28/2007    Past Surgical History:  Procedure Laterality Date  . PENILE PROSTHESIS IMPLANT N/A 2016       Family History  Problem Relation Age of Onset  . Diabetes Mother     Social History   Tobacco Use  . Smoking status: Former Games developer  . Smokeless tobacco: Never Used  Vaping Use  . Vaping Use: Never used  Substance Use Topics  . Alcohol use: Not Currently    Comment: occasional glass of wine  . Drug use: No    Home Medications Prior to Admission medications   Medication Sig Start Date End Date Taking? Authorizing Provider  atorvastatin (LIPITOR) 40 MG tablet Take 20 mg by mouth daily. 09/09/13  Yes [provider]  LANTUS 100 UNIT/ML injection Inject 50 Units into the skin at bedtime.  09/09/13  Yes [provider]  latanoprost (XALATAN) 0.005 % ophthalmic solution  04/22/17  Yes [provider]   NOVOLOG FLEXPEN 100 UNIT/ML SOPN FlexPen Inject 5-7 Units into the skin 3 (three) times daily with meals. Per sliding scale 09/09/13  Yes [provider]  meloxicam (MOBIC) 7.5 MG tablet Take 1 tablet (7.5 mg total) by mouth 2 (two) times daily as needed for pain. 04/20/20   Tarry Kos, MD  sertraline (ZOLOFT) 100 MG tablet Take 1 tablet (100 mg total) by mouth daily. 08/13/18 08/13/19  Burnard Leigh, MD    Allergies    Patient has no known allergies.  Review of Systems   Review of Systems  Gastrointestinal: Positive for abdominal pain.  All other systems reviewed and are negative.   Physical Exam Updated Vital Signs BP (!) 155/98   Pulse 94   Temp 98.2 F (36.8 C) (Oral)   Resp 18   Ht 5\' 4"  (1.626 m)   Wt 85.3 kg   BMI 32.27 kg/m   Physical Exam Vitals and nursing note reviewed.  Constitutional:      Appearance: He is well-developed. He is obese.  HENT:     Head: Normocephalic and atraumatic.     Mouth/Throat:     Mouth: Mucous membranes are moist.     Pharynx: Oropharynx is clear.  Eyes:     Extraocular Movements: Extraocular movements intact.     Pupils: Pupils are equal,  round, and reactive to light.  Cardiovascular:     Rate and Rhythm: Normal rate and regular rhythm.     Heart sounds: Normal heart sounds.  Pulmonary:     Effort: Pulmonary effort is normal.     Breath sounds: Normal breath sounds.  Abdominal:     General: Abdomen is flat. Bowel sounds are normal.     Palpations: Abdomen is soft.     Tenderness: There is generalized abdominal tenderness.  Skin:    General: Skin is warm.     Capillary Refill: Capillary refill takes less than 2 seconds.  Neurological:     General: No focal deficit present.     Mental Status: He is alert and oriented to person, place, and time.  Psychiatric:        Mood and Affect: Mood normal.        Behavior: Behavior normal.     ED Results / Procedures / Treatments   Labs (all labs ordered are  listed, but only abnormal results are displayed) Labs Reviewed  CBC WITH DIFFERENTIAL/PLATELET - Abnormal; Notable for the following components:      Result Value   WBC 3.9 (*)    All other components within normal limits  COMPREHENSIVE METABOLIC PANEL  LIPASE, BLOOD  URINALYSIS, ROUTINE W REFLEX MICROSCOPIC    EKG EKG Interpretation  Date/Time:  Monday April 01 2021 14:28:18 EDT Ventricular Rate:  82 PR Interval:  155 QRS Duration: 97 QT Interval:  377 QTC Calculation: 441 R Axis:   58 Text Interpretation: Sinus rhythm Low voltage, extremity and precordial leads No significant change since last tracing Confirmed by Jacalyn Lefevre [phone]) on 04/01/2021 2:54:39 PM   Radiology No results found.  Procedures Procedures   Medications Ordered in ED Medications  famotidine (PEPCID) IVPB 20 mg premix (has no administration in time range)  alum & mag hydroxide-simeth (MAALOX/MYLANTA) 200-200-20 MG/5ML suspension 30 mL (has no administration in time range)    ED Course  I have reviewed the triage vital signs and the nursing notes.  Pertinent labs & imaging results that were available during my care of the patient were reviewed by me and considered in my medical decision making (see chart for details).    MDM Rules/Calculators/A&P                          Pt's labs and CT are pending at shift change.  Pt is signed out to Dr. Donnald Garre at shift change.  Final Clinical Impression(s) / ED Diagnoses Final diagnoses:  Generalized abdominal pain    Rx / DC Orders ED Discharge Orders    None       Jacalyn Lefevre, MD 04/01/21 1531

## 2021-04-01 NOTE — ED Triage Notes (Signed)
Pt stated that he has had spasms in his epigastric region that radiate toward both the left and right sides.It can be relieved by a belch or passing gas. Today the Pt states that this continues to be a problem and he is concerned that the problem has not resolved itself.

## 2022-12-12 ENCOUNTER — Telehealth: Payer: Self-pay | Admitting: Orthopaedic Surgery

## 2022-12-12 NOTE — Telephone Encounter (Signed)
Patient wants a copy of his medical records from this location. Please advise..[phone]

## 2022-12-16 NOTE — Telephone Encounter (Signed)
IC,lmvm advising needs to sign authorization form. Once that's received, I will call when ready.

## 2022-12-30 ENCOUNTER — Ambulatory Visit: Payer: BC Managed Care – PPO | Admitting: Orthopaedic Surgery

## 2023-01-14 ENCOUNTER — Ambulatory Visit (INDEPENDENT_AMBULATORY_CARE_PROVIDER_SITE_OTHER): Payer: BC Managed Care – PPO | Admitting: Orthopaedic Surgery

## 2023-01-14 ENCOUNTER — Ambulatory Visit (INDEPENDENT_AMBULATORY_CARE_PROVIDER_SITE_OTHER): Payer: BC Managed Care – PPO

## 2023-01-14 DIAGNOSIS — M1712 Unilateral primary osteoarthritis, left knee: Secondary | ICD-10-CM | POA: Diagnosis not present

## 2023-01-14 DIAGNOSIS — M1711 Unilateral primary osteoarthritis, right knee: Secondary | ICD-10-CM

## 2023-01-14 DIAGNOSIS — M170 Bilateral primary osteoarthritis of knee: Secondary | ICD-10-CM | POA: Diagnosis not present

## 2023-01-14 NOTE — Progress Notes (Signed)
Office Visit Note   Patient: Chris [name]           Date of Birth: [birth year]           MRN: [id number] Visit Date: 01/14/2023              Requested by: No referring provider defined for this encounter. PCP: No primary care provider on file.   Assessment & Plan: Visit Diagnoses:  1. Primary osteoarthritis of left knee   2. Primary osteoarthritis of right knee     Plan: Impression is left knee osteoarthritis.  Reassurance provided that I do not see any structural problems and his joint spaces are well-preserved.  I will continue with conservative management with over-the-counter NSAIDs and activity modification.  Follow-up as needed.  Follow-Up Instructions: No follow-ups on file.   Orders:  Orders Placed This Encounter  Procedures   XR KNEE 3 VIEW LEFT   No orders of the defined types were placed in this encounter.     Procedures: No procedures performed   Clinical Data: No additional findings.   Subjective: Chief Complaint  Patient presents with   Left Knee - Pain    HPI Chris [name] is a 57 year old gentleman who comes in for left knee pain that is worse with sit to stand transition.  I have seen him in the past for his other knee.  He works in [place] that he has lost 30 pounds overall.  Denies any injuries or changes in activity.  He has slight swelling towards the end of the day.  Review of Systems  Constitutional: Negative.   HENT: Negative.    Eyes: Negative.   Respiratory: Negative.    Cardiovascular: Negative.   Gastrointestinal: Negative.   Endocrine: Negative.   Genitourinary: Negative.   Skin: Negative.   Allergic/Immunologic: Negative.   Neurological: Negative.   Hematological: Negative.   Psychiatric/Behavioral: Negative.    All other systems reviewed and are negative.    Objective: Vital Signs: There were no vitals taken for this visit.  Physical Exam Vitals and nursing note reviewed.  Constitutional:      Appearance:  He is well-developed.  HENT:     Head: Normocephalic and atraumatic.  Eyes:     Pupils: Pupils are equal, round, and reactive to light.  Pulmonary:     Effort: Pulmonary effort is normal.  Abdominal:     Palpations: Abdomen is soft.  Musculoskeletal:        General: Normal range of motion.     Cervical back: Neck supple.  Skin:    General: Skin is warm.  Neurological:     Mental Status: He is alert and oriented to person, place, and time.  Psychiatric:        Behavior: Behavior normal.        Thought Content: Thought content normal.        Judgment: Judgment normal.     Ortho Exam Examination left knee shows no joint line tenderness or effusion or crepitus.  Normal range of motion.  Collaterals and cruciates are stable. Specialty Comments:  No specialty comments available.  Imaging: No results found.   PMFS History: Patient Active Problem List   Diagnosis Date Noted   CLOSED FRACTURE OF TRIQUETRAL BONE OF WRIST 01/26/2009   HYPERCHOLESTEROLEMIA 01/03/2009   PAIN IN SOFT TISSUES OF LIMB 01/03/2009   DERMATOPHYTOSIS OF FOOT 05/10/2008   FURUNCLE 04/07/2008   ERECTILE DYSFUNCTION 11/02/2007   DIABETES MELLITUS, TYPE II, ON  INSULIN 09/28/2007   Past Medical History:  Diagnosis Date   Bulging lumbar disc    Carpal tunnel syndrome    Diabetes mellitus without complication (HCC)    PTSD (post-traumatic stress disorder)     Family History  Problem Relation Age of Onset   Diabetes Mother     Past Surgical History:  Procedure Laterality Date   PENILE PROSTHESIS IMPLANT N/A 2016   Social History   Occupational History   Not on file  Tobacco Use   Smoking status: Former   Smokeless tobacco: Never  Scientific laboratory technician Use: Never used  Substance and Sexual Activity   Alcohol use: Not Currently    Comment: occasional glass of wine   Drug use: No   Sexual activity: Yes    Partners: Female    Birth control/protection: None

## 2023-01-19 IMAGING — US US ABDOMEN LIMITED RUQ/ASCITES
1 series · 14 of 25 positions shown · non-contrast
Comparison: None.

CLINICAL DATA: Right upper quadrant pain.

EXAM:
ULTRASOUND ABDOMEN LIMITED RIGHT UPPER QUADRANT

[Series 1: us abdomen limited ruq (liver/gb) · 14 of 40 slices shown]
[im 1/40]
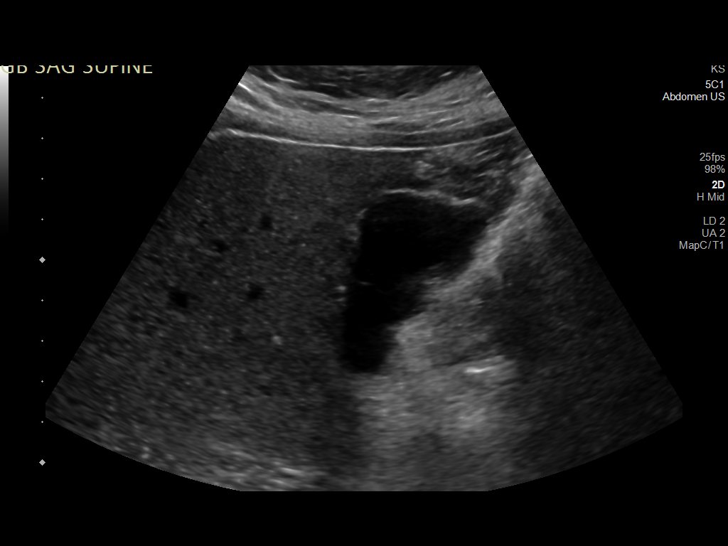
[im 4/40]
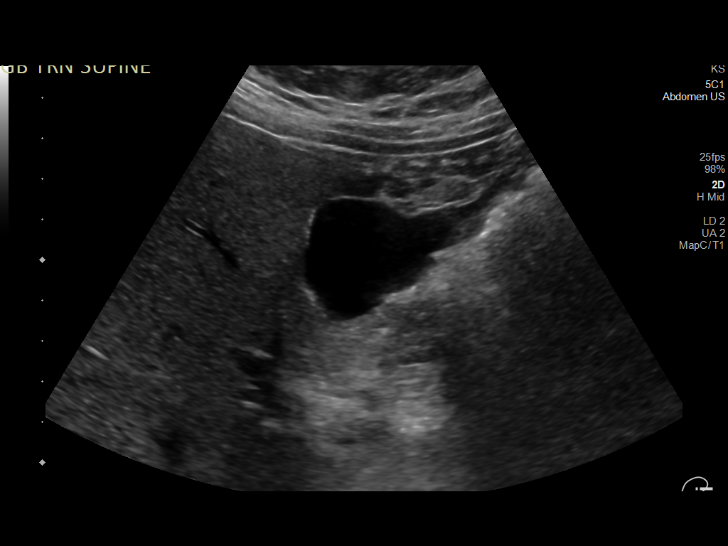
[im 7/40]
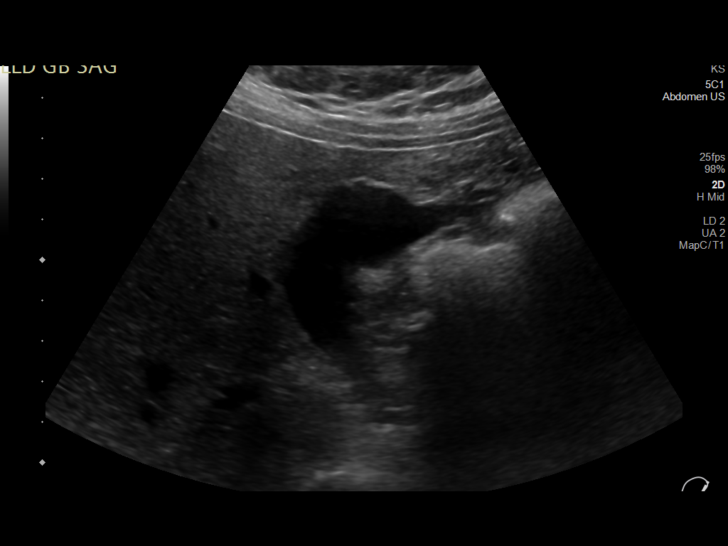
[im 10/40]
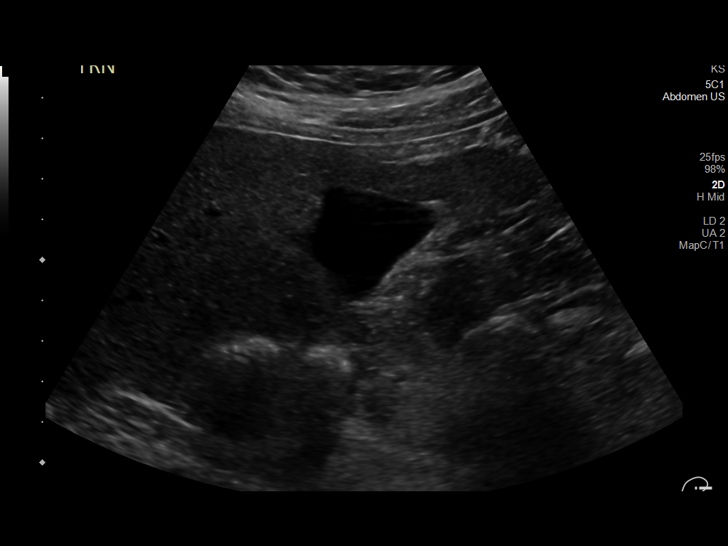
[im 14/40]
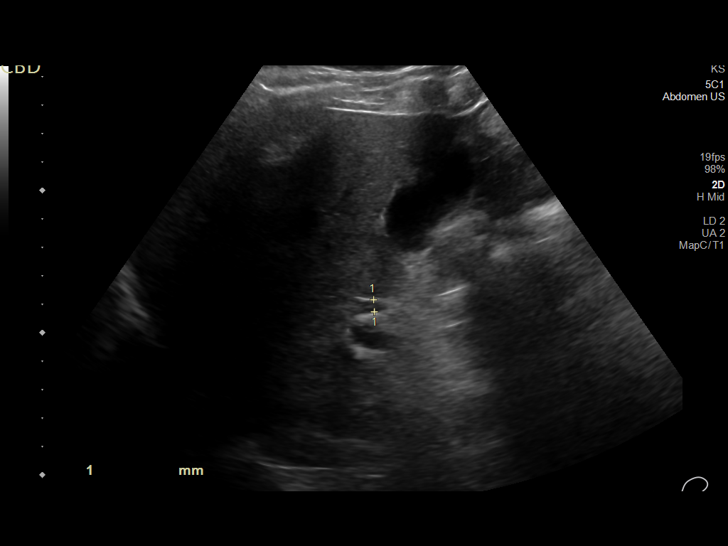
[im 15/40]
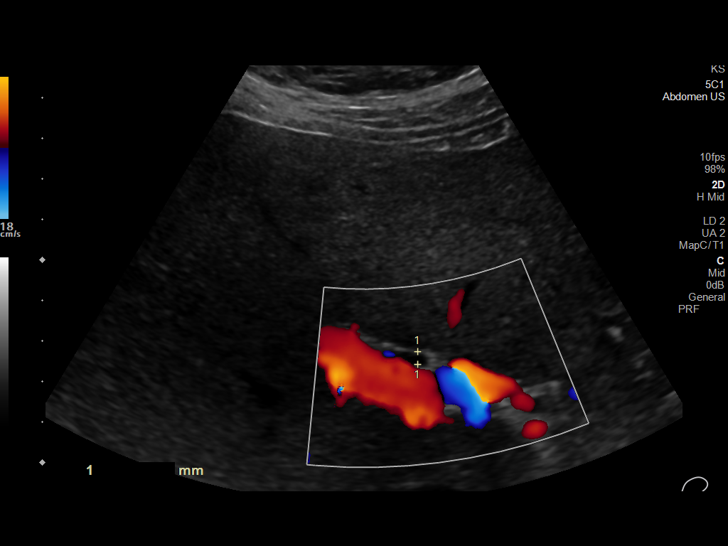
[im 18/40]
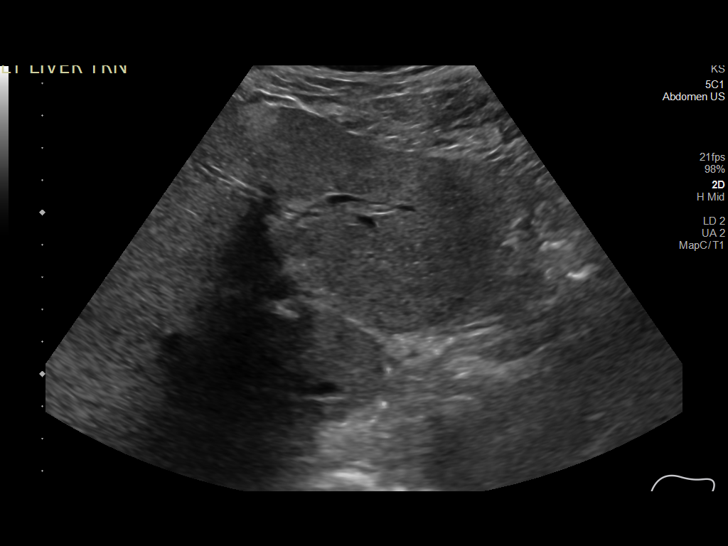
[im 22/40]
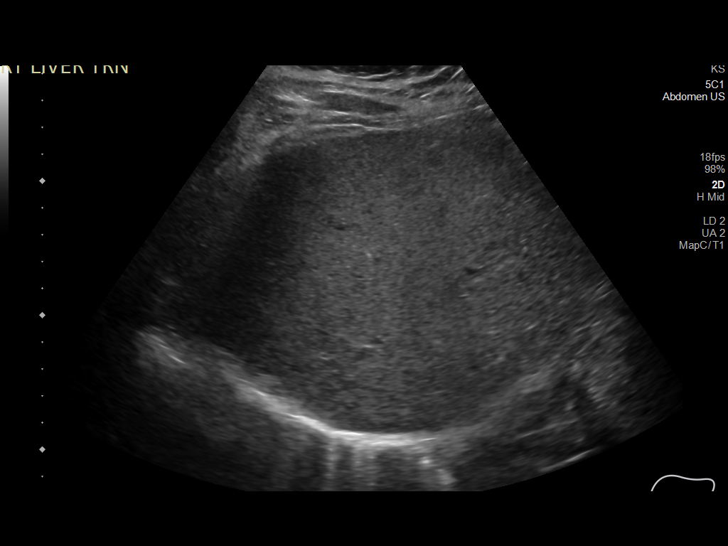
[im 25/40]
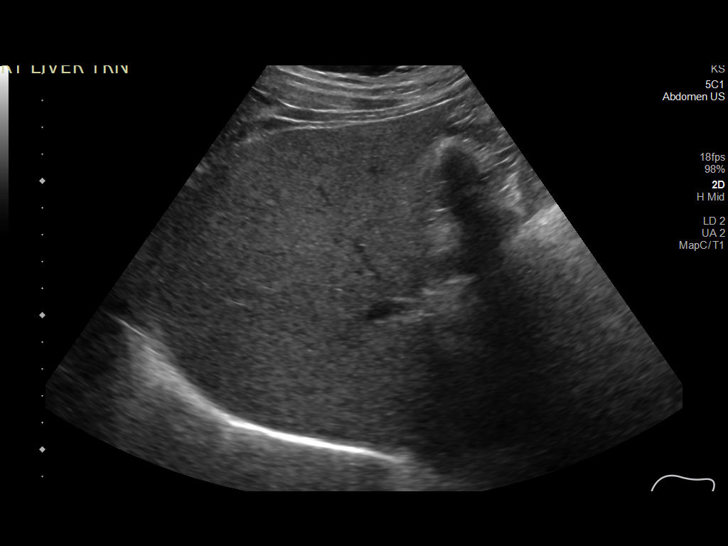
[im 27/40]
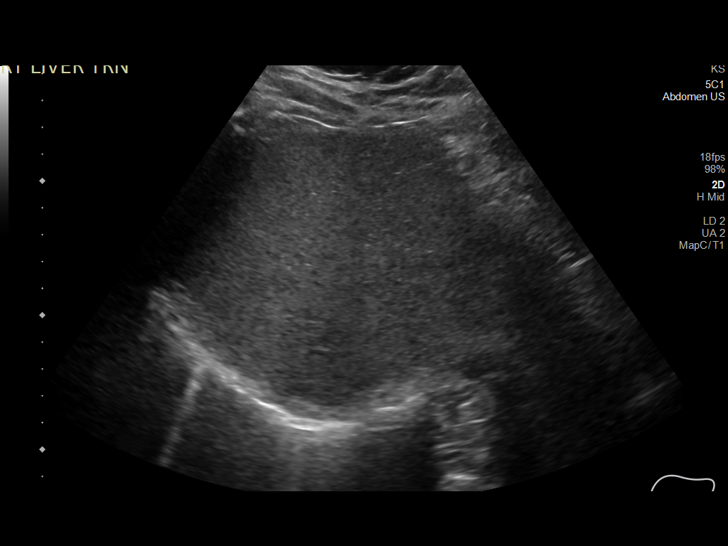
[im 30/40]
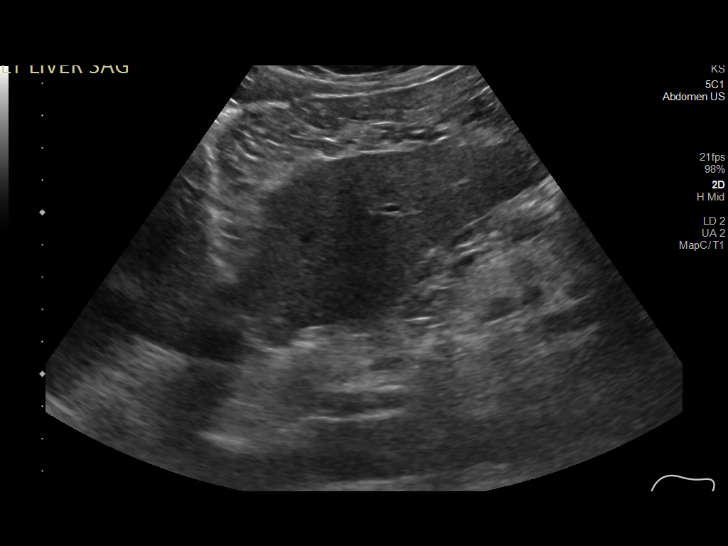
[im 33/40]
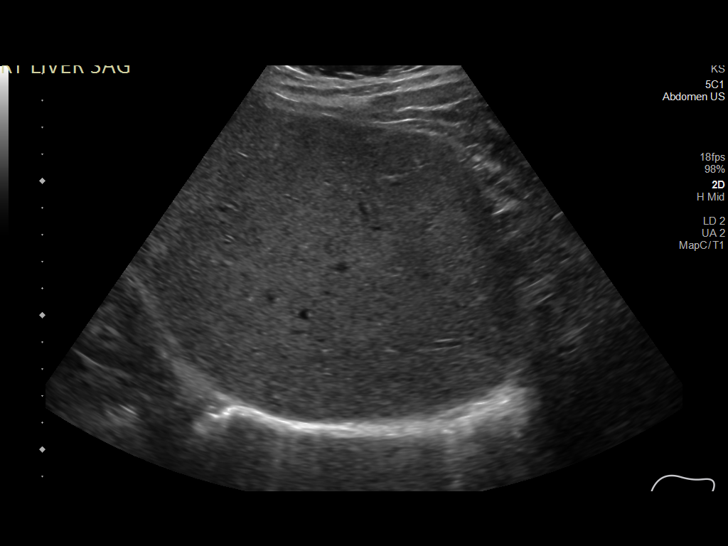
[im 36/40]
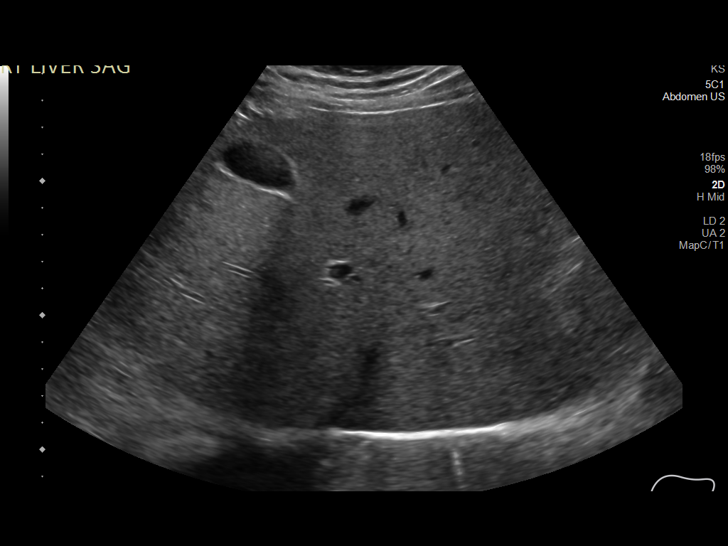
[im 40/40]
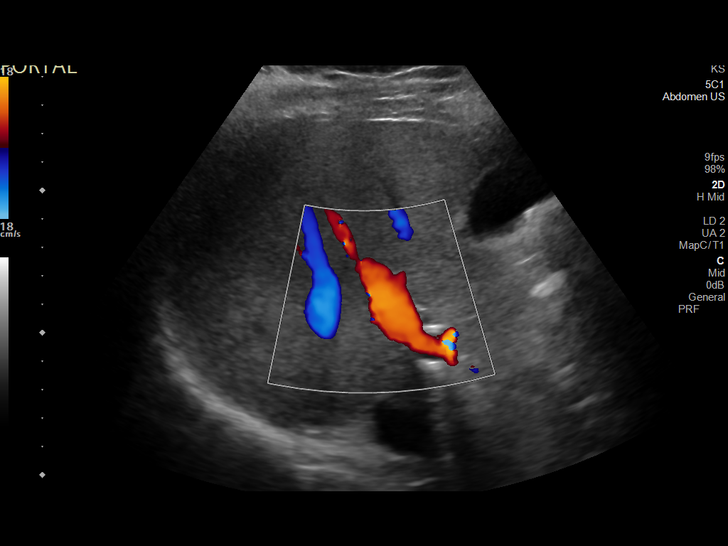

[14 of 25 positions shown; findings below may reference images not displayed]

FINDINGS: Gallbladder:

No gallstones or wall thickening visualized. No sonographic Murphy
sign noted by sonographer.

Common bile duct:

Diameter: 4.2 mm.

Liver:

No focal lesion identified. Within normal limits in parenchymal
echogenicity. Portal vein is patent on color Doppler imaging with
normal direction of blood flow towards the liver.

Other: None.
IMPRESSION: No acute findings.

## 2023-02-03 IMAGING — CT CT ABD-PELV W/ CM
2 of 5 series · 17 of 46 positions shown, 19 images · IV contrast (APPLIED)
Comparison: Ultrasound 03/18/2019

CLINICAL DATA: Acute nonlocalized abdominal pain

EXAM:
CT ABDOMEN AND PELVIS WITH CONTRAST
TECHNIQUE: Multidetector CT imaging of the abdomen and pelvis was performed
using the standard protocol following bolus administration of
intravenous contrast.
CONTRAST:  100mL OMNIPAQUE IOHEXOL 300 MG/ML  SOLN

[Series 2: abd pel w · axial · 0.83mm/px · z∈[+955,+1390]mm · 14 of 97 slices shown, 16 images]
[im 5/97  soft-tissue]
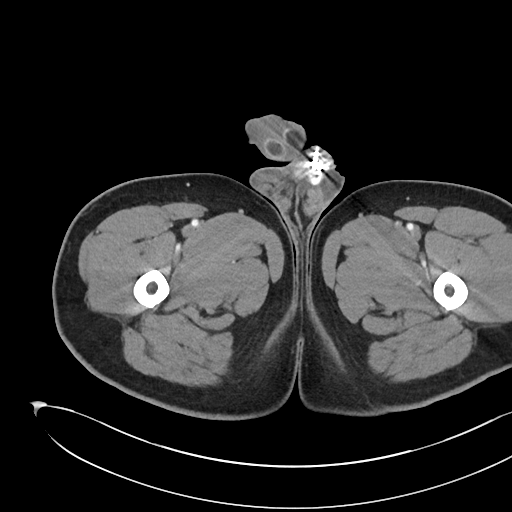
[im 5/97  bone]
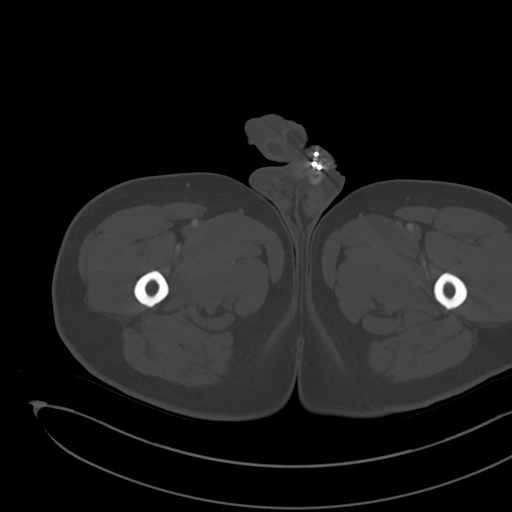
[im 15/97  soft-tissue]
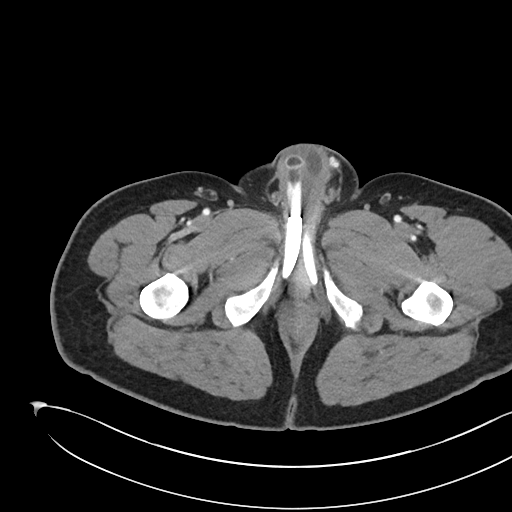
[im 20/97  soft-tissue]
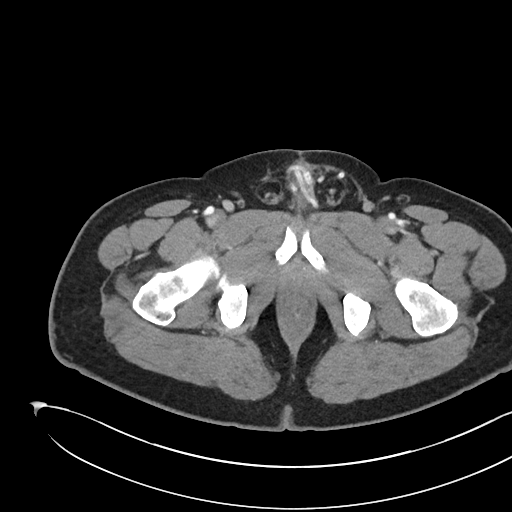
[im 25/97  soft-tissue]
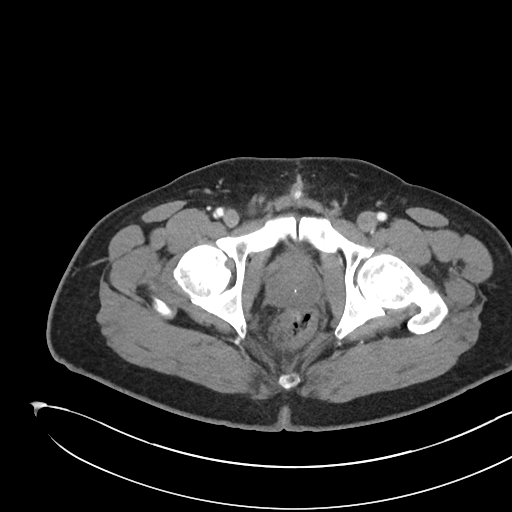
[im 34/97  soft-tissue]
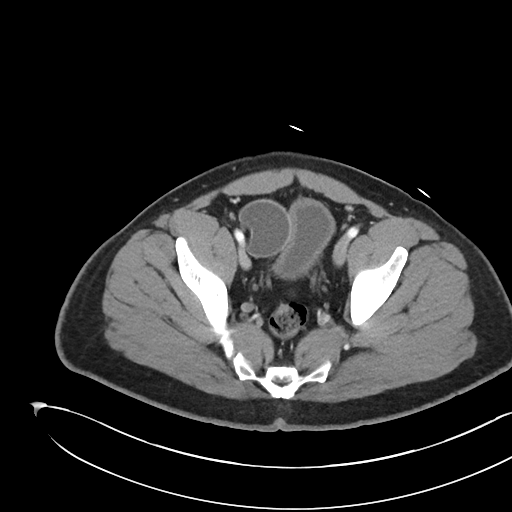
[im 39/97  soft-tissue]
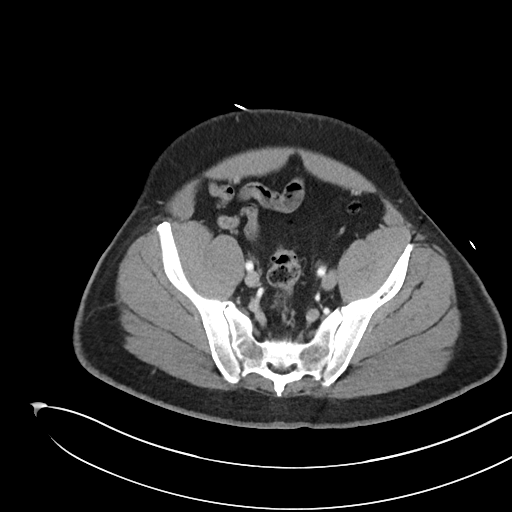
[im 44/97  soft-tissue]
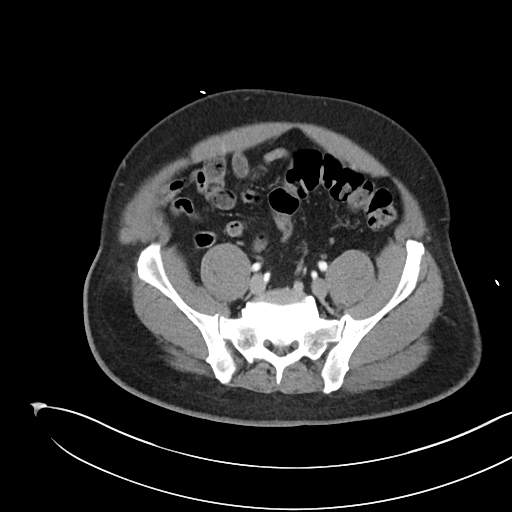
[im 53/97  soft-tissue]
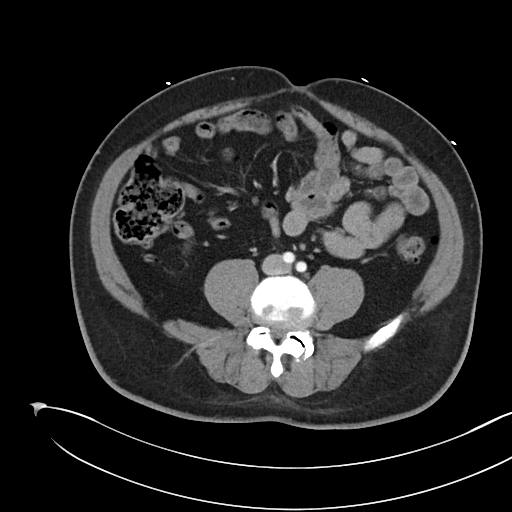
[im 58/97  soft-tissue]
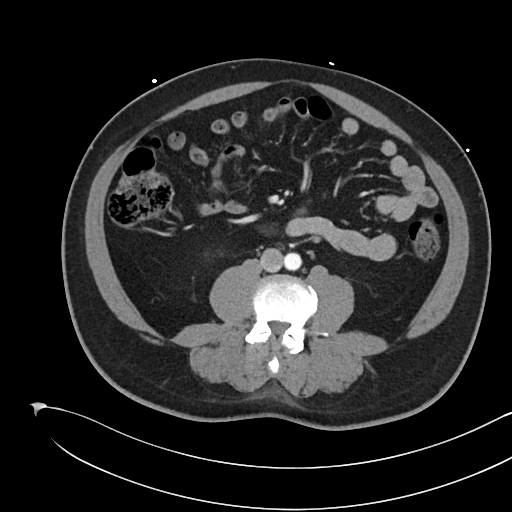
[im 58/97  bone]
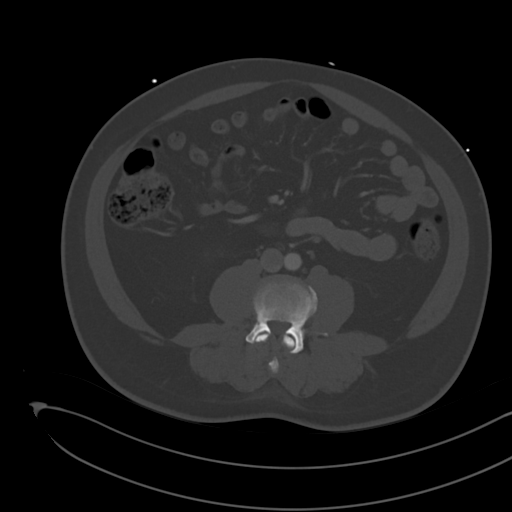
[im 63/97  soft-tissue]
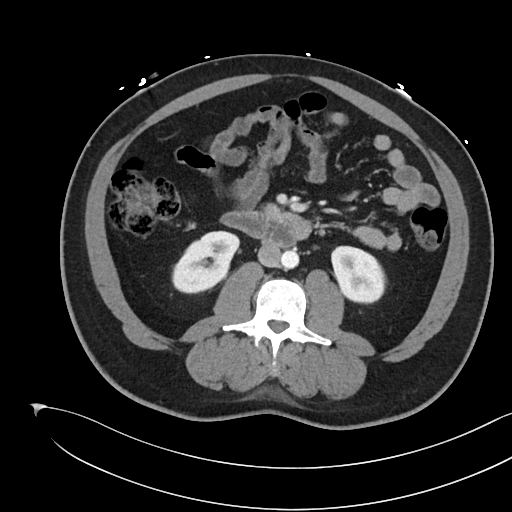
[im 73/97  soft-tissue]
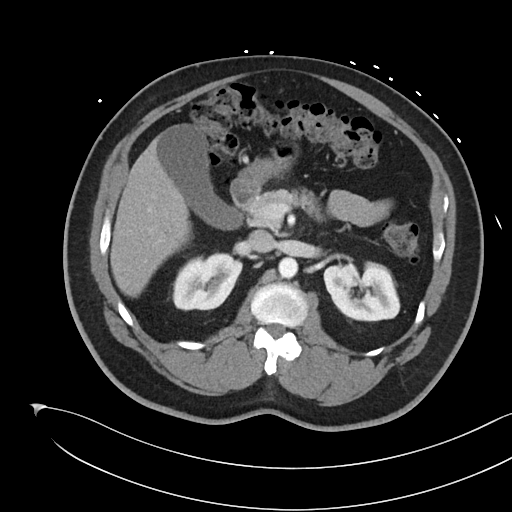
[im 77/97  soft-tissue]
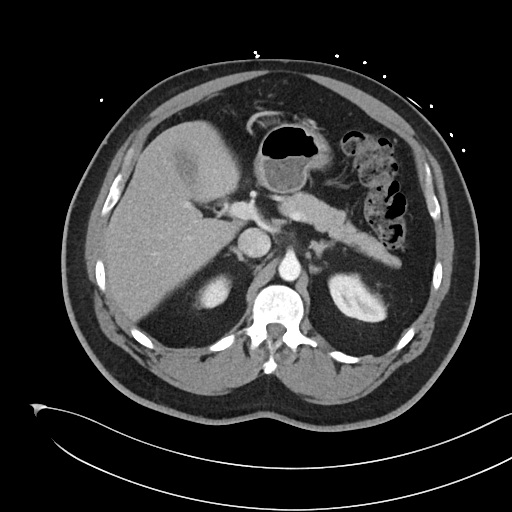
[im 82/97  soft-tissue]
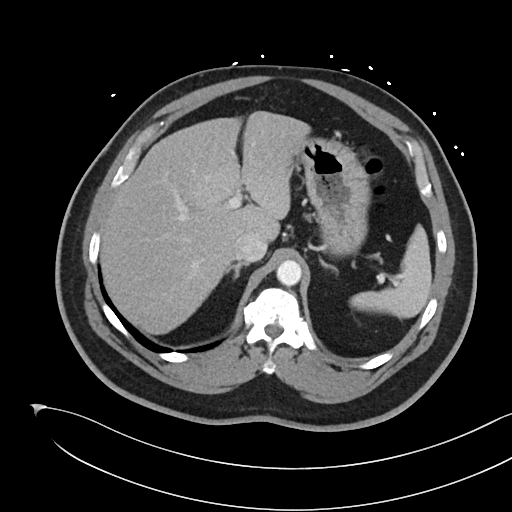
[im 92/97  soft-tissue]
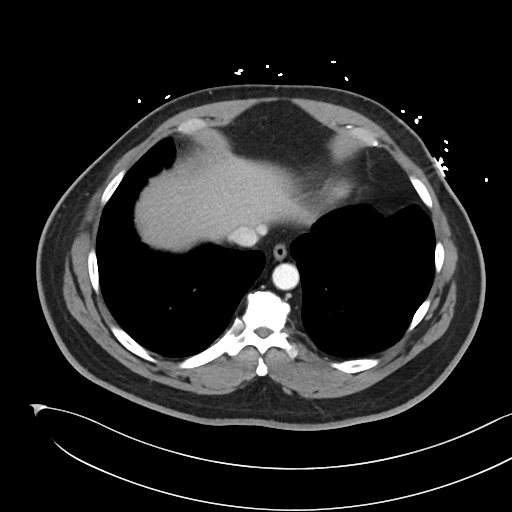

[Series 5: coronal · coronal · 0.82mm/px · 3 of 114 slices shown]
[im 38/114  soft-tissue]
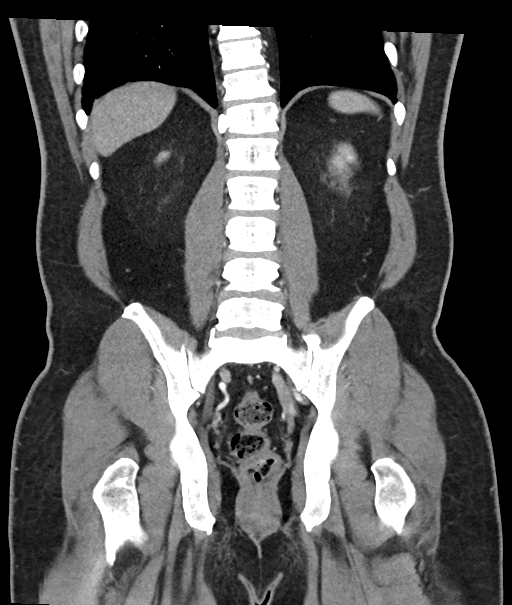
[im 51/114  soft-tissue]
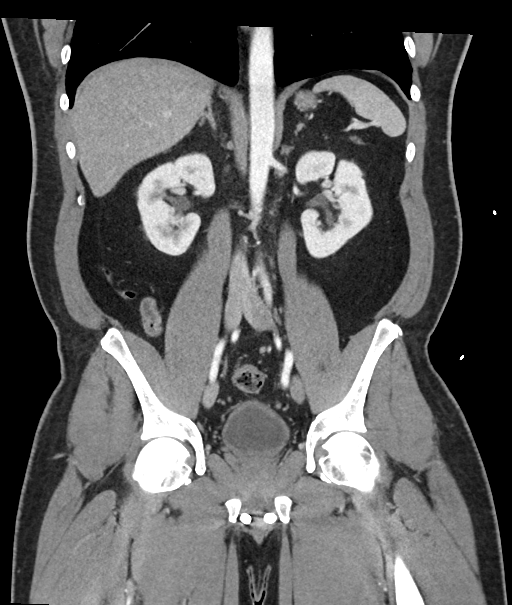
[im 63/114  soft-tissue]
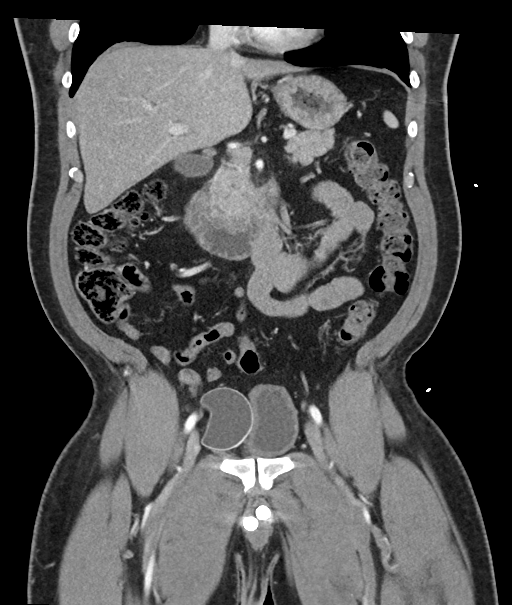

[17 of 46 positions shown; findings below may reference images not displayed]

FINDINGS: Lower chest: Normal

Hepatobiliary: Normal

Pancreas: Normal

Spleen: Normal

Adrenals/Urinary Tract: Adrenal glands are normal. Kidneys are
normal. Bladder is normal.

Stomach/Bowel: Stomach and small intestine are normal. The appendix
is normal. Colon is normal.

Vascular/Lymphatic: The aorta and IVC are normal. No retroperitoneal
adenopathy.

Reproductive: Penile implant.

Other: No free fluid or air.

Musculoskeletal: Ordinary lower lumbar degenerative changes.
IMPRESSION: 1. No acute or significant finding. No cause of acute abdominal pain
identified.
2. Penile implant.
3. Ordinary lower lumbar degenerative changes.

## 2023-03-20 ENCOUNTER — Ambulatory Visit (INDEPENDENT_AMBULATORY_CARE_PROVIDER_SITE_OTHER): Payer: BC Managed Care – PPO | Admitting: Physician Assistant

## 2023-03-20 ENCOUNTER — Telehealth: Payer: BC Managed Care – PPO

## 2023-03-20 ENCOUNTER — Encounter: Payer: Self-pay | Admitting: Physician Assistant

## 2023-03-20 DIAGNOSIS — M1711 Unilateral primary osteoarthritis, right knee: Secondary | ICD-10-CM | POA: Diagnosis not present

## 2023-03-20 MED ORDER — MELOXICAM 7.5 MG PO TABS: 7.5000 mg | ORAL_TABLET | Freq: Every day | ORAL | 0 refills | Status: DC

## 2023-03-20 NOTE — Progress Notes (Signed)
Office Visit Note   Patient: Chris [name]           Date of Birth: [birth year]           MRN: [id number] Visit Date: 03/20/2023              Requested by: No referring provider defined for this encounter. PCP: No primary care provider on file.  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  He comes in today for recurrent right knee pain.  Denies any injury.  It has been getting worse over the week and he is using a cane today.  He walks on concrete floors.  He has been told he has arthritis in his knee.  He had a steroid injection into his knee a couple years ago which helped for a couple weeks.  Assessment & Plan: Visit Diagnoses:  1. Unilateral primary osteoarthritis, right knee     Plan: He has had an MRI of the past of the right knee which does demonstrate mild to moderate degenerative changes.  He has had no new injury.  I think he this is a flare of his arthritis.  He believes his most recent A1c was over 10 and that his diabetes is currently not in good control.  Because of this I will defer a steroid injection.  Viscosupplementation had been mentioned to him in the past and I think this would be a good route for him considering he cannot take his cortisone injection today.  I will also call him a small dose and of meloxicam which she is also taken in the past.  Will follow-up for his injection This patient is diagnosed with osteoarthritis of the knee(s).    Radiographs show evidence of joint space narrowing, osteophytes, subchondral sclerosis and/or subchondral cysts.  This patient has knee pain which interferes with functional and activities of daily living.    This patient has experienced inadequate response, adverse effects and/or intolerance with conservative treatments such as acetaminophen, NSAIDS, topical creams, physical therapy or regular exercise, knee bracing and/or weight loss.   This patient has  experienced inadequate response or has a contraindication to intra articular steroid injections for at least 3 months.   This patient is not scheduled to have a total knee replacement within 6 months of starting treatment with viscosupplementation.   Follow-Up Instructions: No follow-ups on file.   Ortho Exam  Patient is alert, oriented, no adenopathy, well-dressed, normal affect, normal respiratory effort. Right knee he does have some varus alignment.  He has no effusion.  No redness.  Mild warmth.  Compartments are soft and nontender negative Chris [name]' sign he is neurovascular intact  Imaging: No results found. No images are attached to the encounter.  Labs: Lab Results  Component Value Date   HGBA1C 6.7 01/03/2009   HGBA1C 7.9 05/10/2008   HGBA1C 8.0 12/22/2007   LABURIC 6.0 01/03/2009     Lab Results  Component Value Date   ALBUMIN 3.8 04/01/2021   ALBUMIN 3.3 (L) 03/17/2021   ALBUMIN 3.6 10/26/2013    No results found for: "MG" No results found for: "VD25OH"  No results found for: "PREALBUMIN"    Latest Ref Rng & Units 04/01/2021    3:19 PM 03/17/2021    2:15 PM 10/26/2013    6:50 PM  CBC EXTENDED  WBC 4.0 - 10.5 K/uL 3.9  5.7  5.9  RBC 4.22 - 5.81 MIL/uL 4.52  4.71  4.24   Hemoglobin 13.0 - 17.0 g/dL 88.5  02.7  74.1   HCT 39.0 - 52.0 % 44.1  46.5  39.7   Platelets 150 - 400 K/uL 336  361  315   NEUT# 1.7 - 7.7 K/uL 2.2  3.4  3.0   Lymph# 0.7 - 4.0 K/uL 1.4  1.7  2.3      There is no height or weight on file to calculate BMI.  Orders:  No orders of the defined types were placed in this encounter.  Meds ordered this encounter  Medications   meloxicam (MOBIC) 7.5 MG tablet    Sig: Take 1 tablet (7.5 mg total) by mouth daily.    Dispense:  30 tablet    Refill:  0     Procedures: No procedures performed  Clinical Data: No additional findings.  ROS:  All other systems negative, except as noted in the HPI. Review of Systems  Objective: Vital  Signs: There were no vitals taken for this visit.  Specialty Comments:  No specialty comments available.  PMFS History: Patient Active Problem List   Diagnosis Date Noted   Unilateral primary osteoarthritis, right knee 03/20/2023   CLOSED FRACTURE OF TRIQUETRAL BONE OF WRIST 01/26/2009   HYPERCHOLESTEROLEMIA 01/03/2009   PAIN IN SOFT TISSUES OF LIMB 01/03/2009   DERMATOPHYTOSIS OF FOOT 05/10/2008   FURUNCLE 04/07/2008   ERECTILE DYSFUNCTION 11/02/2007   DIABETES MELLITUS, TYPE II, ON INSULIN 09/28/2007   Past Medical History:  Diagnosis Date   Bulging lumbar disc    Carpal tunnel syndrome    Diabetes mellitus without complication (HCC)    PTSD (post-traumatic stress disorder)     Family History  Problem Relation Age of Onset   Diabetes Mother     Past Surgical History:  Procedure Laterality Date   PENILE PROSTHESIS IMPLANT N/A 2016   Social History   Occupational History   Not on file  Tobacco Use   Smoking status: Former   Smokeless tobacco: Never  Building services engineer Use: Never used  Substance and Sexual Activity   Alcohol use: Not Currently    Comment: occasional glass of wine   Drug use: No   Sexual activity: Yes    Partners: Female    Birth control/protection: None

## 2023-03-20 NOTE — Telephone Encounter (Signed)
Right knee gel injection  

## 2023-03-20 NOTE — Progress Notes (Deleted)
   Office Visit Note   Patient: Chris [name]           Date of Birth: September 16, 1966           MRN: [id number] Visit Date:               Requested by: No referring provider defined for this encounter. PCP: No primary care provider on file.   Assessment & Plan: Visit Diagnoses:  1. Unilateral primary osteoarthritis, right knee     Plan: Osteoarthritis right knee.  He does have varus alignment.  Will recommend viscosupplementation as he has an A1c of over 10.  Also will give him a small amount of meloxicam which she is taken in the past.  Knows not to take other anti-inflammatories with this.  Follow-Up Instructions: No follow-ups on file.   Orders:  No orders of the defined types were placed in this encounter.  Meds ordered this encounter  Medications  . meloxicam (MOBIC) 7.5 MG tablet    Sig: Take 1 tablet (7.5 mg total) by mouth daily.    Dispense:  30 tablet    Refill:  0      Procedures: No procedures performed   Clinical Data: No additional findings.   Subjective: Chief Complaint  Patient presents with  . Right Knee - Pain    HPI pleasant 57 year old gentleman with a history of osteoarthritis of his right knee.  Had a flareup in the last week but no injury.  Denies any fever or chills.  He is an uncontrolled diabetic.  He did have a steroid injection into this knee a few years ago which helped him minimally.  Review of Systems  All other systems reviewed and are negative.   Objective: Vital Signs: There were no vitals taken for this visit.  Physical Exam Constitutional:      Appearance: Normal appearance.  Pulmonary:     Effort: Pulmonary effort is normal.  Neurological:     General: No focal deficit present.     Mental Status: He is alert.   Ortho Exam  Specialty Comments:  No specialty comments available.  Imaging: No results found.   PMFS History: Patient Active Problem List   Diagnosis Date Noted  . Unilateral primary osteoarthritis,  right knee 03/20/2023  . CLOSED FRACTURE OF TRIQUETRAL BONE OF WRIST 01/26/2009  . HYPERCHOLESTEROLEMIA 01/03/2009  . PAIN IN SOFT TISSUES OF LIMB 01/03/2009  . DERMATOPHYTOSIS OF FOOT 05/10/2008  . FURUNCLE 04/07/2008  . ERECTILE DYSFUNCTION 11/02/2007  . DIABETES MELLITUS, TYPE II, ON INSULIN 09/28/2007   Past Medical History:  Diagnosis Date  . Bulging lumbar disc   . Carpal tunnel syndrome   . Diabetes mellitus without complication (HCC)   . PTSD (post-traumatic stress disorder)     Family History  Problem Relation Age of Onset  . Diabetes Mother     Past Surgical History:  Procedure Laterality Date  . PENILE PROSTHESIS IMPLANT N/A 2016   Social History   Occupational History  . Not on file  Tobacco Use  . Smoking status: Former  . Smokeless tobacco: Never  Vaping Use  . Vaping Use: Never used  Substance and Sexual Activity  . Alcohol use: Not Currently    Comment: occasional glass of wine  . Drug use: No  . Sexual activity: Yes    Partners: Female    Birth control/protection: None

## 2023-03-23 NOTE — Telephone Encounter (Signed)
VOB submitted for Orthovisc, right knee   

## 2023-04-11 ENCOUNTER — Other Ambulatory Visit: Payer: Self-pay | Admitting: Physician Assistant

## 2023-04-17 ENCOUNTER — Telehealth: Payer: Self-pay

## 2023-04-17 NOTE — Telephone Encounter (Signed)
PA was required for Orthovis, right knee. PA pending.

## 2023-04-24 ENCOUNTER — Telehealth: Payer: Self-pay

## 2023-04-24 NOTE — Telephone Encounter (Signed)
PA still pending for orthovisc, right knee

## 2023-05-14 ENCOUNTER — Other Ambulatory Visit: Payer: Self-pay

## 2023-05-14 DIAGNOSIS — M1711 Unilateral primary osteoarthritis, right knee: Secondary | ICD-10-CM

## 2023-05-20 ENCOUNTER — Encounter: Payer: Self-pay | Admitting: Physician Assistant

## 2023-05-20 ENCOUNTER — Ambulatory Visit: Payer: BC Managed Care – PPO | Admitting: Physician Assistant

## 2023-05-20 DIAGNOSIS — M1711 Unilateral primary osteoarthritis, right knee: Secondary | ICD-10-CM

## 2023-05-20 MED ORDER — HYALURONAN 30 MG/2ML IX SOSY
30.0000 mg | PREFILLED_SYRINGE | INTRA_ARTICULAR | Status: AC | PRN
  Administered 2023-05-20: 30 mg via INTRA_ARTICULAR

## 2023-05-20 NOTE — Progress Notes (Signed)
Office Visit Note   Patient: Chris [name]           Date of Birth: 16-May-1966           MRN: [id number] Visit Date: 05/20/2023              Requested by: Tarry Kos, MD [address] [place],  [place] [id number] PCP: No primary care provider on file.  Chief Complaint  Patient presents with  . Right Knee - Follow-up    Orthovisc #1      HPI: Patient is a pleasant 57 year old gentleman who comes in for his first Orthovisc injection into his right knee.  He has a history of arthritis of his right knee and is failed other treatment.  Has not got long-lasting relief from steroid injections and he is a diabetic  Assessment & Plan: Visit Diagnoses: Osteoarthritis right knee  Plan: Will go forward with first injection today.  Risks of the procedure were reviewed with him in detail.  As well as side effects.  Tolerated the procedure well  Follow-Up Instructions: Return in about 1 week (around 05/27/2023).   Ortho Exam  Patient is alert, oriented, no adenopathy, well-dressed, normal affect, normal respiratory effort. Examination of his right knee no effusion no erythema compartments are soft and nontender he is neurovascularly intact  Imaging: No results found. No images are attached to the encounter.  Labs: Lab Results  Component Value Date   HGBA1C 6.7 01/03/2009   HGBA1C 7.9 05/10/2008   HGBA1C 8.0 12/22/2007   LABURIC 6.0 01/03/2009     Lab Results  Component Value Date   ALBUMIN 3.8 04/01/2021   ALBUMIN 3.3 (L) 03/17/2021   ALBUMIN 3.6 10/26/2013    No results found for: "MG" No results found for: "VD25OH"  No results found for: "PREALBUMIN"    Latest Ref Rng & Units 04/01/2021    3:19 PM 03/17/2021    2:15 PM 10/26/2013    6:50 PM  CBC EXTENDED  WBC 4.0 - 10.5 K/uL 3.9  5.7  5.9   RBC 4.22 - 5.81 MIL/uL 4.52  4.71  4.24   Hemoglobin 13.0 - 17.0 g/dL 78.2  95.6  21.3   HCT 39.0 - 52.0 % 44.1  46.5  39.7   Platelets 150 - 400 K/uL 336  361  315    NEUT# 1.7 - 7.7 K/uL 2.2  3.4  3.0   Lymph# 0.7 - 4.0 K/uL 1.4  1.7  2.3      There is no height or weight on file to calculate BMI.  Orders:  No orders of the defined types were placed in this encounter.  No orders of the defined types were placed in this encounter.    Procedures: Large Joint Inj on 05/20/2023 3:51 PM Indications: pain and diagnostic evaluation Details: 1.5 in anteromedial approach  Arthrogram: No  Medications: 30 mg Hyaluronan 30 MG/2ML Outcome: tolerated well, no immediate complications Procedure, treatment alternatives, risks and benefits explained, specific risks discussed. Consent was given by the patient.    Clinical Data: No additional findings.  ROS:  All other systems negative, except as noted in the HPI. Review of Systems  Objective: Vital Signs: There were no vitals taken for this visit.  Specialty Comments:  No specialty comments available.  PMFS History: Patient Active Problem List   Diagnosis Date Noted  . Unilateral primary osteoarthritis, right knee 03/20/2023  . CLOSED FRACTURE OF TRIQUETRAL BONE OF WRIST 01/26/2009  . HYPERCHOLESTEROLEMIA 01/03/2009  .  PAIN IN SOFT TISSUES OF LIMB 01/03/2009  . DERMATOPHYTOSIS OF FOOT 05/10/2008  . FURUNCLE 04/07/2008  . ERECTILE DYSFUNCTION 11/02/2007  . DIABETES MELLITUS, TYPE II, ON INSULIN 09/28/2007   Past Medical History:  Diagnosis Date  . Bulging lumbar disc   . Carpal tunnel syndrome   . Diabetes mellitus without complication (HCC)   . PTSD (post-traumatic stress disorder)     Family History  Problem Relation Age of Onset  . Diabetes Mother     Past Surgical History:  Procedure Laterality Date  . PENILE PROSTHESIS IMPLANT N/A 2016   Social History   Occupational History  . Not on file  Tobacco Use  . Smoking status: Former  . Smokeless tobacco: Never  Vaping Use  . Vaping Use: Never used  Substance and Sexual Activity  . Alcohol use: Not Currently    Comment:  occasional glass of wine  . Drug use: No  . Sexual activity: Yes    Partners: Female    Birth control/protection: None

## 2023-06-01 ENCOUNTER — Ambulatory Visit (INDEPENDENT_AMBULATORY_CARE_PROVIDER_SITE_OTHER): Payer: BC Managed Care – PPO | Admitting: Physician Assistant

## 2023-06-01 DIAGNOSIS — M1711 Unilateral primary osteoarthritis, right knee: Secondary | ICD-10-CM

## 2023-06-01 MED ORDER — HYALURONAN 30 MG/2ML IX SOSY
30.0000 mg | PREFILLED_SYRINGE | INTRA_ARTICULAR | Status: AC | PRN
  Administered 2023-06-01: 30 mg via INTRA_ARTICULAR

## 2023-06-01 NOTE — Progress Notes (Signed)
Office Visit Note   Patient: Chris [name]           Date of Birth: 1966/01/13           MRN: [id number] Visit Date: 06/01/2023              Requested by: No referring provider defined for this encounter. PCP: Pcp, No  No chief complaint on file.     HPI: Chet is a pleasant 57 year old gentleman with a history of osteoarthritis of his right knee.  He comes in for his second Orthovisc injection into his right knee.  Did not have any adverse reactions from the first injection though he is not sure if he is felt any relief yet  Assessment & Plan: Visit Diagnoses:  1. Unilateral primary osteoarthritis, right knee     Plan: Injection given without difficulty will follow-up in 1 week for his third injection  Follow-Up Instructions: Return in about 1 week (around 06/08/2023).   Ortho Exam  Patient is alert, oriented, no adenopathy, well-dressed, normal affect, normal respiratory effort. Right knee no effusion no erythema compartments are soft and nontender he is neurovascular intact  Imaging: No results found. No images are attached to the encounter.  Labs: Lab Results  Component Value Date   HGBA1C 6.7 01/03/2009   HGBA1C 7.9 05/10/2008   HGBA1C 8.0 12/22/2007   LABURIC 6.0 01/03/2009     Lab Results  Component Value Date   ALBUMIN 3.8 04/01/2021   ALBUMIN 3.3 (L) 03/17/2021   ALBUMIN 3.6 10/26/2013    No results found for: "MG" No results found for: "VD25OH"  No results found for: "PREALBUMIN"    Latest Ref Rng & Units 04/01/2021    3:19 PM 03/17/2021    2:15 PM 10/26/2013    6:50 PM  CBC EXTENDED  WBC 4.0 - 10.5 K/uL 3.9  5.7  5.9   RBC 4.22 - 5.81 MIL/uL 4.52  4.71  4.24   Hemoglobin 13.0 - 17.0 g/dL 82.9  56.2  13.0   HCT 39.0 - 52.0 % 44.1  46.5  39.7   Platelets 150 - 400 K/uL 336  361  315   NEUT# 1.7 - 7.7 K/uL 2.2  3.4  3.0   Lymph# 0.7 - 4.0 K/uL 1.4  1.7  2.3      There is no height or weight on file to calculate BMI.  Orders:  No  orders of the defined types were placed in this encounter.  No orders of the defined types were placed in this encounter.    Procedures: Large Joint Inj: R knee on 06/01/2023 3:51 PM Indications: pain and diagnostic evaluation Details: 22 G 1.5 in needle, anteromedial approach  Arthrogram: No  Medications: 30 mg Hyaluronan 30 MG/2ML Outcome: tolerated well, no immediate complications Procedure, treatment alternatives, risks and benefits explained, specific risks discussed. Consent was given by the patient. Immediately prior to procedure a time out was called to verify the correct patient, procedure, equipment, support staff and site/side marked as required. Patient was prepped and draped in the usual sterile fashion.    Clinical Data: No additional findings.  ROS:  All other systems negative, except as noted in the HPI. Review of Systems  Objective: Vital Signs: There were no vitals taken for this visit.  Specialty Comments:  No specialty comments available.  PMFS History: Patient Active Problem List   Diagnosis Date Noted  . Unilateral primary osteoarthritis, right knee 03/20/2023  . CLOSED FRACTURE OF TRIQUETRAL  BONE OF WRIST 01/26/2009  . HYPERCHOLESTEROLEMIA 01/03/2009  . PAIN IN SOFT TISSUES OF LIMB 01/03/2009  . DERMATOPHYTOSIS OF FOOT 05/10/2008  . FURUNCLE 04/07/2008  . ERECTILE DYSFUNCTION 11/02/2007  . DIABETES MELLITUS, TYPE II, ON INSULIN 09/28/2007   Past Medical History:  Diagnosis Date  . Bulging lumbar disc   . Carpal tunnel syndrome   . Diabetes mellitus without complication (HCC)   . PTSD (post-traumatic stress disorder)     Family History  Problem Relation Age of Onset  . Diabetes Mother     Past Surgical History:  Procedure Laterality Date  . PENILE PROSTHESIS IMPLANT N/A 2016   Social History   Occupational History  . Not on file  Tobacco Use  . Smoking status: Former  . Smokeless tobacco: Never  Vaping Use  . Vaping Use: Never  used  Substance and Sexual Activity  . Alcohol use: Not Currently    Comment: occasional glass of wine  . Drug use: No  . Sexual activity: Yes    Partners: Female    Birth control/protection: None

## 2023-06-15 ENCOUNTER — Encounter: Payer: Self-pay | Admitting: Physician Assistant

## 2023-06-15 ENCOUNTER — Ambulatory Visit (INDEPENDENT_AMBULATORY_CARE_PROVIDER_SITE_OTHER): Payer: BC Managed Care – PPO | Admitting: Physician Assistant

## 2023-06-15 DIAGNOSIS — M1711 Unilateral primary osteoarthritis, right knee: Secondary | ICD-10-CM | POA: Diagnosis not present

## 2023-06-15 MED ORDER — HYALURONAN 30 MG/2ML IX SOSY
30.0000 mg | PREFILLED_SYRINGE | INTRA_ARTICULAR | Status: AC | PRN
  Administered 2023-06-15: 30 mg via INTRA_ARTICULAR

## 2023-06-15 NOTE — Progress Notes (Signed)
Office Visit Note   Patient: Chris [name]           Date of Birth: 1966-03-14           MRN: [id number] Visit Date: 06/15/2023              Requested by: No referring provider defined for this encounter. PCP: Pcp, No  Chief Complaint  Patient presents with  . Right Knee - Follow-up    #3 Orthovisc injection      HPI: Patient is a pleasant 57 year old gentleman who presents for his third Orthovisc injection into his right knee today.  Does not think it helped that much.  Tolerated them well.  Assessment & Plan: Visit Diagnoses: Osteoarthritis right knee  Plan: Micah Flesher forward with injection without difficulty patient will give this about a month and will contact me if it does not help him  Follow-Up Instructions: No follow-ups on file.   Ortho Exam  Patient is alert, oriented, no adenopathy, well-dressed, normal affect, normal respiratory effort. Right knee no effusion no erythema compartments are soft and nontender neurovascular intact  Imaging: No results found. No images are attached to the encounter.  Labs: Lab Results  Component Value Date   HGBA1C 6.7 01/03/2009   HGBA1C 7.9 05/10/2008   HGBA1C 8.0 12/22/2007   LABURIC 6.0 01/03/2009     Lab Results  Component Value Date   ALBUMIN 3.8 04/01/2021   ALBUMIN 3.3 (L) 03/17/2021   ALBUMIN 3.6 10/26/2013    No results found for: "MG" No results found for: "VD25OH"  No results found for: "PREALBUMIN"    Latest Ref Rng & Units 04/01/2021    3:19 PM 03/17/2021    2:15 PM 10/26/2013    6:50 PM  CBC EXTENDED  WBC 4.0 - 10.5 K/uL 3.9  5.7  5.9   RBC 4.22 - 5.81 MIL/uL 4.52  4.71  4.24   Hemoglobin 13.0 - 17.0 g/dL 95.6  21.3  08.6   HCT 39.0 - 52.0 % 44.1  46.5  39.7   Platelets 150 - 400 K/uL 336  361  315   NEUT# 1.7 - 7.7 K/uL 2.2  3.4  3.0   Lymph# 0.7 - 4.0 K/uL 1.4  1.7  2.3      There is no height or weight on file to calculate BMI.  Orders:  No orders of the defined types were placed in this  encounter.  No orders of the defined types were placed in this encounter.    Procedures: Large Joint Inj: R knee on 06/15/2023 4:21 PM Indications: pain and diagnostic evaluation Details: 22 G 1.5 in needle, anteromedial approach  Arthrogram: No  Medications: 30 mg Hyaluronan 30 MG/2ML Outcome: tolerated well, no immediate complications Procedure, treatment alternatives, risks and benefits explained, specific risks discussed. Consent was given by the patient.    Clinical Data: No additional findings.  ROS:  All other systems negative, except as noted in the HPI. Review of Systems  Objective: Vital Signs: There were no vitals taken for this visit.  Specialty Comments:  No specialty comments available.  PMFS History: Patient Active Problem List   Diagnosis Date Noted  . Unilateral primary osteoarthritis, right knee 03/20/2023  . CLOSED FRACTURE OF TRIQUETRAL BONE OF WRIST 01/26/2009  . HYPERCHOLESTEROLEMIA 01/03/2009  . PAIN IN SOFT TISSUES OF LIMB 01/03/2009  . DERMATOPHYTOSIS OF FOOT 05/10/2008  . FURUNCLE 04/07/2008  . ERECTILE DYSFUNCTION 11/02/2007  . DIABETES MELLITUS, TYPE II, ON INSULIN 09/28/2007  Past Medical History:  Diagnosis Date  . Bulging lumbar disc   . Carpal tunnel syndrome   . Diabetes mellitus without complication (HCC)   . PTSD (post-traumatic stress disorder)     Family History  Problem Relation Age of Onset  . Diabetes Mother     Past Surgical History:  Procedure Laterality Date  . PENILE PROSTHESIS IMPLANT N/A 2016   Social History   Occupational History  . Not on file  Tobacco Use  . Smoking status: Former  . Smokeless tobacco: Never  Vaping Use  . Vaping Use: Never used  Substance and Sexual Activity  . Alcohol use: Not Currently    Comment: occasional glass of wine  . Drug use: No  . Sexual activity: Yes    Partners: Female    Birth control/protection: None

## 2023-07-06 ENCOUNTER — Ambulatory Visit (HOSPITAL_COMMUNITY)
Admission: EM | Admit: 2023-07-06 | Discharge: 2023-07-06 | Disposition: A | Discharge status: Home / Self Care | Payer: BC Managed Care – PPO | Attending: Family Medicine | Admitting: Family Medicine

## 2023-07-06 ENCOUNTER — Encounter (HOSPITAL_COMMUNITY): Payer: Self-pay

## 2023-07-06 DIAGNOSIS — M654 Radial styloid tenosynovitis [de Quervain]: Secondary | ICD-10-CM

## 2023-07-06 DIAGNOSIS — I808 Phlebitis and thrombophlebitis of other sites: Secondary | ICD-10-CM

## 2023-07-06 MED ORDER — MELOXICAM 7.5 MG PO TABS: 7.5000 mg | ORAL_TABLET | Freq: Every day | ORAL | 0 refills | Status: AC

## 2023-07-06 NOTE — ED Triage Notes (Signed)
Pt had blood work a week ago out of his left arm. Pt reports some pain in his hand and wrist.

## 2023-07-06 NOTE — Discharge Instructions (Addendum)
As discussed you may either have a tendonitis or thrombophlebitis of your left arm.  You may also have both.  The good news is I am treating you for both and the treatments overlap!  You can take the Mobic once every day for anti-inflammatory and pain relief.  Please do not use any ibuprofen/Advil while taking this medicine.  Apply ice (such as bag of frozen veggies) for 15 to 20 minutes at a time.  This can also help reduce inflammation  Use the wrist brace as needed for support.  Please try these interventions for the next week or so.  If you are still having pain, I would like you to see the orthopedic specialist.  Their information is below  There is a work note attached

## 2023-07-06 NOTE — ED Provider Notes (Signed)
[facility]    CSN: [id number] Arrival date & time: 07/06/23  1849     History   Chief Complaint Chief Complaint  Patient presents with   Arm Pain    HPI Chris [name] is a 57 y.o. male.  Here with left arm pain for 1 week He had blood drawn and felt a shooting pain from needle site to the thumb.  It resolved quickly.  Then he started having some discomfort by the thumb and lateral wrist with certain movements.  Denies any swelling or redness. No fever. No weakness of arm.  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  Denies previous injury.  History of carpal tunnel  Has not attempted intervention   Past Medical History:  Diagnosis Date   Bulging lumbar disc    Carpal tunnel syndrome    Diabetes mellitus without complication (HCC)    PTSD (post-traumatic stress disorder)     Patient Active Problem List   Diagnosis Date Noted   Unilateral primary osteoarthritis, right knee 03/20/2023   CLOSED FRACTURE OF TRIQUETRAL BONE OF WRIST 01/26/2009   HYPERCHOLESTEROLEMIA 01/03/2009   PAIN IN SOFT TISSUES OF LIMB 01/03/2009   DERMATOPHYTOSIS OF FOOT 05/10/2008   FURUNCLE 04/07/2008   ERECTILE DYSFUNCTION 11/02/2007   DIABETES MELLITUS, TYPE II, ON INSULIN 09/28/2007    Past Surgical History:  Procedure Laterality Date   PENILE PROSTHESIS IMPLANT N/A 2016       Home Medications    Prior to Admission medications   Medication Sig Start Date End Date Taking? Authorizing Provider  atorvastatin (LIPITOR) 40 MG tablet Take 20 mg by mouth daily. 09/09/13  Yes [provider]  LANTUS 100 UNIT/ML injection Inject 50 Units into the skin at bedtime.  09/09/13  Yes [provider]  latanoprost (XALATAN) 0.005 % ophthalmic solution  04/22/17  Yes [provider]  meloxicam (MOBIC) 7.5 MG tablet Take 1 tablet (7.5 mg total) by mouth daily. 07/06/23  Yes Lance Huaracha, PA-C  NOVOLOG FLEXPEN 100 UNIT/ML SOPN FlexPen Inject 5-7 Units  into the skin 3 (three) times daily with meals. Per sliding scale 09/09/13  Yes [provider]  omeprazole (PRILOSEC) 20 MG capsule Take 1 capsule (20 mg total) by mouth daily. 04/01/21  Yes Arby Barrette, MD  ondansetron (ZOFRAN ODT) 4 MG disintegrating tablet Take 1 tablet (4 mg total) by mouth every 4 (four) hours as needed for nausea or vomiting. 04/01/21   Arby Barrette, MD  sertraline (ZOLOFT) 100 MG tablet Take 1 tablet (100 mg total) by mouth daily. 08/13/18 08/13/19  Eksir, Bo Mcclintock, MD    Family History Family History  Problem Relation Age of Onset   Diabetes Mother     Social History Social History   Tobacco Use   Smoking status: Former   Smokeless tobacco: Never  Advertising account planner   Vaping status: Never Used  Substance Use Topics   Alcohol use: Not Currently    Comment: occasional glass of wine   Drug use: No     Allergies   Patient has no known allergies.   Review of Systems Review of Systems Per HPI  Physical Exam Triage Vital Signs ED Triage Vitals [07/06/23 1947]  Encounter Vitals Group     BP (!) 142/93     Systolic BP Percentile      Diastolic BP Percentile      Pulse Rate 97     Resp 16     Temp 98.1  F (36.7 C)     Temp src      SpO2 98 %     Weight      Height      Head Circumference      Peak Flow      Pain Score      Pain Loc      Pain Education      Exclude from Growth Chart    No data found.  Updated Vital Signs BP (!) 142/93 (BP Location: Left Arm)   Pulse 97   Temp 98.1 F (36.7 C)   Resp 16   SpO2 98%    Physical Exam Vitals and nursing note reviewed.  Constitutional:      General: He is not in acute distress. HENT:     Mouth/Throat:     Pharynx: Oropharynx is clear.  Cardiovascular:     Rate and Rhythm: Normal rate and regular rhythm.     Pulses: Normal pulses.     Heart sounds: Normal heart sounds.  Pulmonary:     Effort: Pulmonary effort is normal.     Breath sounds: Normal breath sounds.   Musculoskeletal:        General: No swelling, deformity or signs of injury. Normal range of motion.     Comments: Full ROM of left elbow, wrist, fingers.  Positive Finkelstein test.  No bony tenderness, no snuffbox tenderness.  Distal sensation intact, cap refill less than 2 seconds.  Strong radial pulse  Skin:    General: Skin is warm and dry.     Capillary Refill: Capillary refill takes less than 2 seconds.     Comments: There is no erythema, swelling, rash on left arm.  Neurological:     Mental Status: He is alert and oriented to person, place, and time.     UC Treatments / Results  Labs (all labs ordered are listed, but only abnormal results are displayed) Labs Reviewed - No data to display  EKG  Radiology No results found.  Procedures Procedures   Medications Ordered in UC Medications - No data to display  Initial Impression / Assessment and Plan / UC Course  I have reviewed the triage vital signs and the nursing notes.  Pertinent labs & imaging results that were available during my care of the patient were reviewed by me and considered in my medical decision making (see chart for details).  He is afebrile, well-appearing, neurovascularly intact. Given recent blood draw, consider thrombophlebitis.  Physical exam is overall unremarkable and there is no tenderness or swelling noted.  Also consider deQuervain's tenosynovitis given positive Finkelstein test.  He does a lot of work with his hands and may have some inflammation that coincidentally is causing symptoms since the blood draw.  Provided with wrist brace for support.  Discussed use of Mobic once daily which should help no matter what dx is. Advised ice and rest as well. Will allow a week or so for symptoms to improve. If no change will follow with ortho. Provided work note. Return precautions discussed. Patient agrees to plan  Final Clinical Impressions(s) / UC Diagnoses   Final diagnoses:  De Quervain's  tenosynovitis, left  Thrombophlebitis of arm, left     Discharge Instructions      As discussed you may either have a tendonitis or thrombophlebitis of your left arm.  You may also have both.  The good news is I am treating you for both and the treatments overlap!  You can take  the Mobic once every day for anti-inflammatory and pain relief.  Please do not use any ibuprofen/Advil while taking this medicine.  Apply ice (such as bag of frozen veggies) for 15 to 20 minutes at a time.  This can also help reduce inflammation  Use the wrist brace as needed for support.  Please try these interventions for the next week or so.  If you are still having pain, I would like you to see the orthopedic specialist.  Their information is below  There is a work note attached     ED Prescriptions     Medication Sig Dispense Auth. Provider   meloxicam (MOBIC) 7.5 MG tablet Take 1 tablet (7.5 mg total) by mouth daily. 30 tablet Corinna Burkman, Lurena Joiner, PA-C      PDMP not reviewed this encounter.   Millenia Waldvogel, Ray Church 07/06/23 2052

## 2023-12-22 ENCOUNTER — Telehealth: Payer: Self-pay | Admitting: Physician Assistant

## 2023-12-22 NOTE — Telephone Encounter (Signed)
Pt requesting Handicap placard.

## 2023-12-23 NOTE — Telephone Encounter (Signed)
 Called patient. Made appt. for re-evaluation.

## 2023-12-24 ENCOUNTER — Encounter: Payer: Self-pay | Admitting: Physician Assistant

## 2023-12-24 ENCOUNTER — Ambulatory Visit (INDEPENDENT_AMBULATORY_CARE_PROVIDER_SITE_OTHER): Payer: BC Managed Care – PPO | Admitting: Physician Assistant

## 2023-12-24 DIAGNOSIS — M1711 Unilateral primary osteoarthritis, right knee: Secondary | ICD-10-CM

## 2023-12-24 NOTE — Progress Notes (Signed)
 Office Visit Note   Patient: Chris [name]           Date of Birth: 1966-03-14           MRN: [id number] Visit Date: 12/24/2023              Requested by: No referring provider defined for this encounter. PCP: Pcp, No  Chief Complaint  Patient presents with   Right Knee - Pain   Left Knee - Pain      HPI: Patient is a pleasant 58 year old gentleman with a history of osteoarthritis of his right knee.  He has had Orthovisc injections which have not been very helpful to him.  He can only walk short distances without having significant pain request handicap parking placard  Assessment & Plan: Visit Diagnoses: Osteoarthritis right knee  Plan: I gave him a completed application as he does have quite a bit of limitations.  Told him if he wants to discuss further knee treatment he could speak with Dr. Jerri who has followed him this in the past  Follow-Up Instructions: No follow-ups on file.   Ortho Exam  Patient is alert, oriented, no adenopathy, well-dressed, normal affect, normal respiratory effort.   Imaging: No results found. No images are attached to the encounter.  Labs: Lab Results  Component Value Date   HGBA1C 6.7 01/03/2009   HGBA1C 7.9 05/10/2008   HGBA1C 8.0 12/22/2007   LABURIC 6.0 01/03/2009     Lab Results  Component Value Date   ALBUMIN 3.8 04/01/2021   ALBUMIN 3.3 (L) 03/17/2021   ALBUMIN 3.6 10/26/2013    No results found for: MG No results found for: VD25OH  No results found for: PREALBUMIN    Latest Ref Rng & Units 04/01/2021    3:19 PM 03/17/2021    2:15 PM 10/26/2013    6:50 PM  CBC EXTENDED  WBC 4.0 - 10.5 K/uL 3.9  5.7  5.9   RBC 4.22 - 5.81 MIL/uL 4.52  4.71  4.24   Hemoglobin 13.0 - 17.0 g/dL 85.5  84.9  86.6   HCT 39.0 - 52.0 % 44.1  46.5  39.7   Platelets 150 - 400 K/uL 336  361  315   NEUT# 1.7 - 7.7 K/uL 2.2  3.4  3.0   Lymph# 0.7 - 4.0 K/uL 1.4  1.7  2.3      There is no height or weight on file to calculate  BMI.  Orders:  No orders of the defined types were placed in this encounter.  No orders of the defined types were placed in this encounter.    Procedures: No procedures performed  Clinical Data: No additional findings.  ROS:  All other systems negative, except as noted in the HPI. Review of Systems  Objective: Vital Signs: There were no vitals taken for this visit.  Specialty Comments:  No specialty comments available.  PMFS History: Patient Active Problem List   Diagnosis Date Noted   Unilateral primary osteoarthritis, right knee 03/20/2023   CLOSED FRACTURE OF TRIQUETRAL BONE OF WRIST 01/26/2009   HYPERCHOLESTEROLEMIA 01/03/2009   PAIN IN SOFT TISSUES OF LIMB 01/03/2009   DERMATOPHYTOSIS OF FOOT 05/10/2008   FURUNCLE 04/07/2008   ERECTILE DYSFUNCTION 11/02/2007   DIABETES MELLITUS, TYPE II, ON INSULIN 09/28/2007   Past Medical History:  Diagnosis Date   Bulging lumbar disc    Carpal tunnel syndrome    Diabetes mellitus without complication (HCC)    PTSD (post-traumatic stress disorder)  Family History  Problem Relation Age of Onset   Diabetes Mother     Past Surgical History:  Procedure Laterality Date   PENILE PROSTHESIS IMPLANT N/A 2016   Social History   Occupational History   Not on file  Tobacco Use   Smoking status: Former   Smokeless tobacco: Never  Vaping Use   Vaping status: Never Used  Substance and Sexual Activity   Alcohol use: Not Currently    Comment: occasional glass of wine   Drug use: No   Sexual activity: Yes    Partners: Female    Birth control/protection: None

## 2023-12-25 ENCOUNTER — Encounter: Payer: Self-pay | Admitting: Orthopaedic Surgery

## 2023-12-25 ENCOUNTER — Ambulatory Visit (INDEPENDENT_AMBULATORY_CARE_PROVIDER_SITE_OTHER): Payer: BC Managed Care – PPO | Admitting: Orthopaedic Surgery

## 2023-12-25 VITALS — Ht 64.0 in | Wt 185.0 lb

## 2023-12-25 DIAGNOSIS — M170 Bilateral primary osteoarthritis of knee: Secondary | ICD-10-CM

## 2023-12-25 DIAGNOSIS — M1712 Unilateral primary osteoarthritis, left knee: Secondary | ICD-10-CM | POA: Diagnosis not present

## 2023-12-25 DIAGNOSIS — M1711 Unilateral primary osteoarthritis, right knee: Secondary | ICD-10-CM

## 2023-12-25 NOTE — Progress Notes (Addendum)
 Office Visit Note   Patient: Chris [name]           Date of Birth: 1966-06-13           MRN: [id number] Visit Date: 12/25/2023              Requested by: No referring provider defined for this encounter. PCP: Pcp, No   Assessment & Plan: Visit Diagnoses:  1. Primary osteoarthritis of left knee   2. Primary osteoarthritis of right knee     Plan: Chris [name] is a 58 year old gentleman with bilateral knee pain from osteoarthritis worse in the right.  He has had cortisone and gel injections at this point.  Both of which provide about 3 to 4 weeks of relief.  At this point he will continue with symptomatic management.  He will follow-up as needed.  Follow-Up Instructions: No follow-ups on file.   Orders:  No orders of the defined types were placed in this encounter.  No orders of the defined types were placed in this encounter.     Procedures: No procedures performed   Clinical Data: No additional findings.   Subjective: Chief Complaint  Patient presents with   Right Knee - Pain   Left Knee - Pain    HPI Chris [name] is a 58 year old gentleman who returns for follow-up evaluation of bilateral knee pain.  He has had cortisone and gel injections that each give about 3 to 4 weeks of relief.  He continues to manage the symptoms with over-the-counter medications.  Review of Systems  Constitutional: Negative.   HENT: Negative.    Eyes: Negative.   Respiratory: Negative.    Cardiovascular: Negative.   Gastrointestinal: Negative.   Endocrine: Negative.   Genitourinary: Negative.   Skin: Negative.   Allergic/Immunologic: Negative.   Neurological: Negative.   Hematological: Negative.   Psychiatric/Behavioral: Negative.    All other systems reviewed and are negative.    Objective: Vital Signs: Ht 5' 4 (1.626 m)   Wt 185 lb (83.9 kg)   BMI 31.76 kg/m   Physical Exam Vitals and nursing note reviewed.  Constitutional:      Appearance: He is well-developed.  HENT:      Head: Normocephalic and atraumatic.  Eyes:     Pupils: Pupils are equal, round, and reactive to light.  Pulmonary:     Effort: Pulmonary effort is normal.  Abdominal:     Palpations: Abdomen is soft.  Musculoskeletal:        General: Normal range of motion.     Cervical back: Neck supple.  Skin:    General: Skin is warm.  Neurological:     Mental Status: He is alert and oriented to person, place, and time.  Psychiatric:        Behavior: Behavior normal.        Thought Content: Thought content normal.        Judgment: Judgment normal.     Ortho Exam Exam nation of bilateral knees is unchanged from prior visit. Specialty Comments:  No specialty comments available.  Imaging: No results found.   PMFS History: Patient Active Problem List   Diagnosis Date Noted   Unilateral primary osteoarthritis, right knee 03/20/2023   CLOSED FRACTURE OF TRIQUETRAL BONE OF WRIST 01/26/2009   HYPERCHOLESTEROLEMIA 01/03/2009   PAIN IN SOFT TISSUES OF LIMB 01/03/2009   DERMATOPHYTOSIS OF FOOT 05/10/2008   FURUNCLE 04/07/2008   ERECTILE DYSFUNCTION 11/02/2007   DIABETES MELLITUS, TYPE II, ON INSULIN 09/28/2007  Past Medical History:  Diagnosis Date   Bulging lumbar disc    Carpal tunnel syndrome    Diabetes mellitus without complication (HCC)    PTSD (post-traumatic stress disorder)     Family History  Problem Relation Age of Onset   Diabetes Mother     Past Surgical History:  Procedure Laterality Date   PENILE PROSTHESIS IMPLANT N/A 2016   Social History   Occupational History   Not on file  Tobacco Use   Smoking status: Former   Smokeless tobacco: Never  Vaping Use   Vaping status: Never Used  Substance and Sexual Activity   Alcohol use: Not Currently    Comment: occasional glass of wine   Drug use: No   Sexual activity: Yes    Partners: Female    Birth control/protection: None

## 2024-08-12 ENCOUNTER — Ambulatory Visit
Admission: EM | Admit: 2024-08-12 | Discharge: 2024-08-12 | Disposition: A | Discharge status: Home / Self Care | Attending: Family Medicine | Admitting: Family Medicine

## 2024-08-12 ENCOUNTER — Encounter: Payer: Self-pay | Admitting: Emergency Medicine

## 2024-08-12 DIAGNOSIS — B029 Zoster without complications: Secondary | ICD-10-CM

## 2024-08-12 MED ORDER — VALACYCLOVIR HCL 1 G PO TABS: 1000.0000 mg | ORAL_TABLET | Freq: Three times a day (TID) | ORAL | 0 refills | Status: AC

## 2024-08-12 MED ORDER — GABAPENTIN 300 MG PO CAPS: 300.0000 mg | ORAL_CAPSULE | Freq: Three times a day (TID) | ORAL | 0 refills | Status: AC

## 2024-08-12 NOTE — ED Triage Notes (Signed)
 Patient c/o red bumps on the left side of face above his eye, forehead into hair since yesterday.  The bumps on face are painful, applying alcohol to the area.  No history of shingles.

## 2024-08-12 NOTE — ED Provider Notes (Signed)
 [facility]  Note:  This document was prepared using Conservation officer, historic buildings and may include unintentional dictation errors.  MRN: [id number] DOB: 1966-08-15  Subjective:   Chris [name] is a 58 y.o. male presenting for 1 day history of red painful bumps over the left side of his forehead and on his scalp.  Applied alcohol to the area which has made it feel worse.  No vision change, eye involvement.  However, he does feel like it is on his eyebrow and eyelid.  No fever, drainage of pus or bleeding.  No current facility-administered medications for this encounter.  Current Outpatient Medications:    LANTUS 100 UNIT/ML injection, Inject 50 Units into the skin at bedtime. , Disp: , Rfl:    NOVOLOG FLEXPEN 100 UNIT/ML SOPN FlexPen, Inject 5-7 Units into the skin 3 (three) times daily with meals. Per sliding scale, Disp: , Rfl:    atorvastatin (LIPITOR) 40 MG tablet, Take 20 mg by mouth daily., Disp: , Rfl:    latanoprost (XALATAN) 0.005 % ophthalmic solution, , Disp: , Rfl:    meloxicam  (MOBIC ) 7.5 MG tablet, Take 1 tablet (7.5 mg total) by mouth daily., Disp: 30 tablet, Rfl: 0   omeprazole  (PRILOSEC) 20 MG capsule, Take 1 capsule (20 mg total) by mouth daily., Disp: 30 capsule, Rfl: 1   ondansetron  (ZOFRAN  ODT) 4 MG disintegrating tablet, Take 1 tablet (4 mg total) by mouth every 4 (four) hours as needed for nausea or vomiting., Disp: 20 tablet, Rfl: 0   sertraline  (ZOLOFT ) 100 MG tablet, Take 1 tablet (100 mg total) by mouth daily., Disp: 90 tablet, Rfl: 3   No Known Allergies  Past Medical History:  Diagnosis Date   Bulging lumbar disc    Carpal tunnel syndrome    Diabetes mellitus without complication (HCC)    PTSD (post-traumatic stress disorder)      Past Surgical History:  Procedure Laterality Date   PENILE PROSTHESIS IMPLANT N/A 2016    Family History  Problem Relation Age of Onset   Diabetes Mother     Social History   Tobacco Use    Smoking status: Former   Smokeless tobacco: Never  Advertising account planner   Vaping status: Never Used  Substance Use Topics   Alcohol use: Not Currently    Comment: occasional glass of wine   Drug use: No    ROS   Objective:   Vitals: BP 136/89 (BP Location: Left Arm)   Pulse 81   Temp 98.5 F (36.9 C) (Oral)   Resp 18   Ht 5' 4 (1.626 m)   Wt 170 lb (77.1 kg)   SpO2 97%   BMI 29.18 kg/m   Physical Exam Constitutional:      General: He is not in acute distress.    Appearance: Normal appearance. He is well-developed and normal weight. He is not ill-appearing, toxic-appearing or diaphoretic.  HENT:     Head: Normocephalic and atraumatic.      Right Ear: External ear normal.     Left Ear: External ear normal.     Nose: Nose normal.     Mouth/Throat:     Pharynx: Oropharynx is clear.  Eyes:     General: Lids are everted, no foreign bodies appreciated. No scleral icterus.       Right eye: No foreign body, discharge or hordeolum.        Left eye: No foreign body, discharge or hordeolum.     Extraocular  Movements: Extraocular movements intact.     Conjunctiva/sclera:     Right eye: Right conjunctiva is not injected. No chemosis, exudate or hemorrhage.    Left eye: Left conjunctiva is not injected. No chemosis, exudate or hemorrhage. Cardiovascular:     Rate and Rhythm: Normal rate.  Pulmonary:     Effort: Pulmonary effort is normal.  Musculoskeletal:     Cervical back: Normal range of motion.  Neurological:     Mental Status: He is alert and oriented to person, place, and time.  Psychiatric:        Mood and Affect: Mood normal.        Behavior: Behavior normal.        Thought Content: Thought content normal.        Judgment: Judgment normal.     Assessment and Plan :   PDMP not reviewed this encounter.  1. Herpes zoster without complication    Physical exam findings consistent with shingles.  Recommend starting valacyclovir , use gabapentin  for neuropathic pain.   Counseled patient on potential for adverse effects with medications prescribed/recommended today, ER and return-to-clinic precautions discussed, patient verbalized understanding.    Christopher Savannah, [place] 08/12/24 [id number]

## 2024-08-15 ENCOUNTER — Telehealth: Payer: Self-pay | Admitting: Nurse Practitioner

## 2024-08-15 NOTE — Telephone Encounter (Signed)
 Erroneous encounter

## 2024-10-17 ENCOUNTER — Encounter: Payer: Self-pay | Admitting: Radiology
